# Patient Record
Sex: Female | Born: 1995 | Race: Black or African American | Hispanic: No | Marital: Single | Smoking: Never smoker
Health system: Southern US, Community
[De-identification: ages and names within clinical notes are randomized; demographics above are authoritative.]

## PROBLEM LIST (undated history)

## (undated) ENCOUNTER — Inpatient Hospital Stay (HOSPITAL_COMMUNITY): Payer: Self-pay

## (undated) DIAGNOSIS — Z789 Other specified health status: Secondary | ICD-10-CM

## (undated) DIAGNOSIS — D649 Anemia, unspecified: Secondary | ICD-10-CM

## (undated) HISTORY — PX: NO PAST SURGERIES: SHX2092

---
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
## 2019-04-12 ENCOUNTER — Emergency Department (HOSPITAL_COMMUNITY)
Admission: EM | Admit: 2019-04-12 | Discharge: 2019-04-12 | Disposition: A | Discharge status: Home / Self Care | Payer: Self-pay | Attending: Emergency Medicine | Admitting: Emergency Medicine

## 2019-04-12 ENCOUNTER — Ambulatory Visit (HOSPITAL_COMMUNITY)
Admission: EM | Admit: 2019-04-12 | Discharge: 2019-04-12 | Disposition: A | Discharge status: Home / Self Care | Payer: Self-pay | Attending: Internal Medicine | Admitting: Internal Medicine

## 2019-04-12 ENCOUNTER — Other Ambulatory Visit: Payer: Self-pay

## 2019-04-12 ENCOUNTER — Encounter (HOSPITAL_COMMUNITY): Payer: Self-pay | Admitting: Emergency Medicine

## 2019-04-12 ENCOUNTER — Emergency Department (HOSPITAL_COMMUNITY): Payer: Self-pay

## 2019-04-12 ENCOUNTER — Ambulatory Visit (INDEPENDENT_AMBULATORY_CARE_PROVIDER_SITE_OTHER): Payer: Self-pay

## 2019-04-12 DIAGNOSIS — Z3202 Encounter for pregnancy test, result negative: Secondary | ICD-10-CM

## 2019-04-12 DIAGNOSIS — R1084 Generalized abdominal pain: Secondary | ICD-10-CM | POA: Insufficient documentation

## 2019-04-12 DIAGNOSIS — R110 Nausea: Secondary | ICD-10-CM | POA: Insufficient documentation

## 2019-04-12 DIAGNOSIS — K56600 Partial intestinal obstruction, unspecified as to cause: Secondary | ICD-10-CM

## 2019-04-12 LAB — URINALYSIS, ROUTINE W REFLEX MICROSCOPIC
Bilirubin Urine: NEGATIVE
Glucose, UA: NEGATIVE mg/dL
Hgb urine dipstick: NEGATIVE
Ketones, ur: 5 mg/dL — AB
Leukocytes,Ua: NEGATIVE
Nitrite: NEGATIVE
Protein, ur: NEGATIVE mg/dL
Specific Gravity, Urine: 1.024 (ref 1.005–1.030)
pH: 5 (ref 5.0–8.0)

## 2019-04-12 LAB — I-STAT BETA HCG BLOOD, ED (MC, WL, AP ONLY): I-stat hCG, quantitative: <5 m[IU]/mL (ref <5)

## 2019-04-12 LAB — COMPREHENSIVE METABOLIC PANEL
ALT: 22 U/L (ref 0–44)
AST: 19 U/L (ref 15–41)
Albumin: 3.6 g/dL (ref 3.5–5.0)
Alkaline Phosphatase: 77 U/L (ref 38–126)
Anion gap: 9 (ref 5–15)
BUN: 10 mg/dL (ref 6–20)
CO2: 23 mmol/L (ref 22–32)
Calcium: 9 mg/dL (ref 8.9–10.3)
Chloride: 102 mmol/L (ref 98–111)
Creatinine, Ser: 0.71 mg/dL (ref 0.44–1.00)
GFR calc Af Amer: >60 mL/min (ref >60)
GFR calc non Af Amer: >60 mL/min (ref >60)
Glucose, Bld: 98 mg/dL (ref 70–99)
Potassium: 3.9 mmol/L (ref 3.5–5.1)
Sodium: 134 mmol/L — ABNORMAL LOW (ref 135–145)
Total Bilirubin: 0.4 mg/dL (ref 0.3–1.2)
Total Protein: 7.9 g/dL (ref 6.5–8.1)

## 2019-04-12 LAB — CBC
HCT: 41.9 % (ref 36.0–46.0)
Hemoglobin: 13.5 g/dL (ref 12.0–15.0)
MCH: 29.8 pg (ref 26.0–34.0)
MCHC: 32.2 g/dL (ref 30.0–36.0)
MCV: 92.5 fL (ref 80.0–100.0)
Platelets: 325 10*3/uL (ref 150–400)
RBC: 4.53 MIL/uL (ref 3.87–5.11)
RDW: 13.2 % (ref 11.5–15.5)
WBC: 12.9 10*3/uL — ABNORMAL HIGH (ref 4.0–10.5)
nRBC: 0 % (ref 0.0–0.2)

## 2019-04-12 LAB — POCT URINALYSIS DIP (DEVICE)
Bilirubin Urine: NEGATIVE
Glucose, UA: NEGATIVE mg/dL
Hgb urine dipstick: NEGATIVE
Ketones, ur: NEGATIVE mg/dL
Leukocytes,Ua: NEGATIVE
Nitrite: NEGATIVE
Protein, ur: NEGATIVE mg/dL
Specific Gravity, Urine: 1.025 (ref 1.005–1.030)
Urobilinogen, UA: 0.2 mg/dL (ref 0.0–1.0)
pH: 6.5 (ref 5.0–8.0)

## 2019-04-12 LAB — POCT PREGNANCY, URINE: Preg Test, Ur: NEGATIVE

## 2019-04-12 LAB — POC URINE PREG, ED: Preg Test, Ur: NEGATIVE

## 2019-04-12 LAB — LIPASE, BLOOD: Lipase: 17 U/L (ref 11–51)

## 2019-04-12 MED ORDER — IOHEXOL 300 MG/ML  SOLN
100.0000 mL | Freq: Once | INTRAMUSCULAR | Status: AC | PRN
  Administered 2019-04-12: 18:00:00 100 mL via INTRAVENOUS

## 2019-04-12 MED ORDER — SODIUM CHLORIDE 0.9% FLUSH
3.0000 mL | Freq: Once | INTRAVENOUS | Status: AC
  Administered 2019-04-12: 3 mL via INTRAVENOUS

## 2019-04-12 MED ORDER — ONDANSETRON 4 MG PO TBDP: ORAL_TABLET | ORAL | 0 refills | Status: DC

## 2019-04-12 NOTE — ED Triage Notes (Signed)
C/o generalized abd pain since yesterday with nausea.  Denies vomiting and diarrhea.

## 2019-04-12 NOTE — ED Provider Notes (Signed)
[facility] EMERGENCY DEPARTMENT Provider Note   CSN: [id number] Arrival date & time: 04/12/19  1520     History Chief Complaint  Patient presents with   Abdominal Pain    Katherine Velasquez is a 24 y.o. female.  Patient seen earlier today at urgent care with complaint of generalized abdominal pain with nausea, onset yesterday. No vomiting or diarrhea. Pain worsened overnight. No urinary symptoms. No fever or chills. Plain film obtained at urgent care revealed findings concerning for ileus/low grade SBO.    Abdominal Pain Pain location:  Generalized Pain quality: gnawing   Pain severity:  Moderate Onset quality:  Sudden Duration:  2 days Timing:  Intermittent Progression:  Worsening Chronicity:  New Associated symptoms: nausea   Associated symptoms: no constipation, no diarrhea and no vomiting        Past Medical History:  Diagnosis Date   Anemia    Medical history non-contributory     Patient Active Problem List   Diagnosis Date Noted   Menorrhagia 06/21/2015   Severe anemia 01/01/2015   Iron deficiency anemia due to chronic blood loss 05/24/2012   Menorrhagia with regular cycle 05/24/2012    Past Surgical History:  Procedure Laterality Date   NO PAST SURGERIES       OB History    Gravida  0   Para  0   Term  0   Preterm  0   AB  0   Living  0     SAB  0   TAB  0   Ectopic  0   Multiple  0   Live Births              No family history on file.  Social History   Tobacco Use   Smoking status: Never Smoker   Smokeless tobacco: Never Used  Substance Use Topics   Alcohol use: No   Drug use: No    Home Medications Prior to Admission medications   Medication Sig Start Date End Date Taking? Authorizing Provider  Iron TABS Take 1 tablet by mouth daily. 01/02/15   Edwinna Areola, DO  norgestimate-ethinyl estradiol (ORTHO-CYCLEN,SPRINTEC,PREVIFEM) 0.25-35 MG-MCG tablet Take 1 tablet by mouth daily.  06/14/12 01/23/13  Constant, Peggy, MD    Allergies    Patient has no known allergies.  Review of Systems   Review of Systems  Constitutional: Negative for appetite change.  Gastrointestinal: Positive for abdominal pain and nausea. Negative for constipation, diarrhea and vomiting.  Genitourinary: Negative for difficulty urinating.  Musculoskeletal: Negative for back pain.  All other systems reviewed and are negative.   Physical Exam Updated Vital Signs BP 122/80    Pulse 71    Temp 98.7 F (37.1 C) (Oral)    Resp 16    LMP 03/20/2019    SpO2 100%   Physical Exam Constitutional:      Appearance: She is well-developed. She is not ill-appearing.  HENT:     Head: Normocephalic.  Eyes:     Conjunctiva/sclera: Conjunctivae normal.  Cardiovascular:     Rate and Rhythm: Normal rate and regular rhythm.  Pulmonary:     Effort: Pulmonary effort is normal.     Breath sounds: Normal breath sounds.  Abdominal:     General: Abdomen is flat. There is no distension or abdominal bruit.     Palpations: Abdomen is soft.     Tenderness: There is generalized abdominal tenderness. There is no right CVA tenderness or left CVA tenderness.  Musculoskeletal:        General: No swelling.  Skin:    General: Skin is warm and dry.  Neurological:     Mental Status: She is alert and oriented to person, place, and time.  Psychiatric:        Mood and Affect: Mood normal.     ED Results / Procedures / Treatments   Labs (all labs ordered are listed, but only abnormal results are displayed) Labs Reviewed  COMPREHENSIVE METABOLIC PANEL - Abnormal; Notable for the following components:      Result Value   Sodium 134 (*)    All other components within normal limits  CBC - Abnormal; Notable for the following components:   WBC 12.9 (*)    All other components within normal limits  LIPASE, BLOOD  URINALYSIS, ROUTINE W REFLEX MICROSCOPIC  I-STAT BETA HCG BLOOD, ED ([facility], [facility], [facility] ONLY)     EKG None  Radiology DG Abdomen 1 View  Result Date: 04/12/2019 CLINICAL DATA:  Abdominal pain since yesterday with normal bowel movements. EXAM: ABDOMEN - 1 VIEW COMPARISON:  None. FINDINGS: Two images of the abdomen and pelvis. Small bowel loops within the left side of the abdomen measure up to 4.5 cm. Normal caliber colon, stool and gas filled. No gross free intraperitoneal air. No abnormal abdominal calcifications. No appendicolith. IMPRESSION: Mild to moderate small bowel dilatation, which could represent mild adynamic ileus or low-grade partial small bowel obstruction. Electronically Signed   By: Abigail Miyamoto M.D.   On: 04/12/2019 14:52   CT ABDOMEN PELVIS W CONTRAST  Result Date: 04/12/2019 CLINICAL DATA:  Abdominal pain. EXAM: CT ABDOMEN AND PELVIS WITH CONTRAST TECHNIQUE: Multidetector CT imaging of the abdomen and pelvis was performed using the standard protocol following bolus administration of intravenous contrast. CONTRAST:  118mL OMNIPAQUE IOHEXOL 300 MG/ML  SOLN COMPARISON:  None. FINDINGS: Lower chest: No acute abnormality. Hepatobiliary: No focal liver abnormality is seen. Mild diffuse fatty infiltration of the liver parenchyma is noted. No gallstones, gallbladder wall thickening, or biliary dilatation. Pancreas: Unremarkable. No pancreatic ductal dilatation or surrounding inflammatory changes. Spleen: Normal in size without focal abnormality. Adrenals/Urinary Tract: Adrenal glands are unremarkable. Kidneys are normal, without renal calculi, focal lesion, or hydronephrosis. Bladder is unremarkable. Stomach/Bowel: Stomach is within normal limits. Appendix appears normal. No evidence of bowel wall thickening, distention, or inflammatory changes. Vascular/Lymphatic: No significant vascular findings are present. No enlarged abdominal or pelvic lymph nodes. Reproductive: The uterus is unremarkable. A 2.0 cm cystic appearing area is seen along the posterior aspect of the right adnexa (axial  CT image 73, CT series number 3). Other: No abdominal wall hernia or abnormality. No abdominopelvic ascites. Musculoskeletal: No acute or significant osseous findings. IMPRESSION: 1. Fatty liver. 2. Small right adnexal cyst, likely ovarian in origin. Electronically Signed   By: Virgina Norfolk M.D.   On: 04/12/2019 18:21    Procedures Procedures (including critical care time)  Medications Ordered in ED Medications  sodium chloride flush (NS) 0.9 % injection 3 mL (has no administration in time range)    ED Course  I have reviewed the triage vital signs and the nursing notes.  Pertinent labs & imaging results that were available during my care of the patient were reviewed by me and considered in my medical decision making (see chart for details).    MDM Rules/Calculators/A&P                      Patient is nontoxic,  nonseptic appearing, in no apparent distress.   Labs, imaging and vitals reviewed.  Patient does not meet the SIRS or Sepsis criteria.  On repeat exam patient does not have a surgical abdomen and there are no peritoneal signs.  No indication of appendicitis, bowel obstruction, bowel perforation, cholecystitis, diverticulitis, PID or ectopic pregnancy.  Patient discharged home with symptomatic treatment and given strict instructions for follow-up with their primary care physician.  I have also discussed reasons to return immediately to the ER.  Patient expresses understanding and agrees with plan. .   Final Clinical Impression(s) / ED Diagnoses Final diagnoses:  Generalized abdominal pain  Nausea    Rx / DC Orders ED Discharge Orders    None       Felicie Morn, NP 04/12/19 2317    Sabas Sous, MD 04/13/19 [phone]

## 2019-04-12 NOTE — ED Provider Notes (Signed)
[facility]    CSN: [id number] Arrival date & time: 04/12/19  1245      History   Chief Complaint Chief Complaint  Patient presents with  . Abdominal Pain    HPI Katherine Velasquez is a 24 y.o. female with no past medical history comes to urgent care with a 2-day history of generalized abdominal pain with nausea which started yesterday.  Patient says pain is worsened overnight.  She has some nausea but no vomiting.  She denies any abdominal distention.  No known aggravating factors.  No known relieving factors.  Pain is fairly constant at this time.  Patient had a bowel movement this morning.  Bowel movement was apparently soft to loose.  No dizziness, fever or chills.  Patient has not tried any over-the-counter medications. HPI  Past Medical History:  Diagnosis Date  . Anemia   . Medical history non-contributory     Patient Active Problem List   Diagnosis Date Noted  . Menorrhagia 06/21/2015  . Severe anemia 01/01/2015  . Iron deficiency anemia due to chronic blood loss 05/24/2012  . Menorrhagia with regular cycle 05/24/2012    Past Surgical History:  Procedure Laterality Date  . NO PAST SURGERIES      OB History    Gravida  0   Para  0   Term  0   Preterm  0   AB  0   Living  0     SAB  0   TAB  0   Ectopic  0   Multiple  0   Live Births               Home Medications    Prior to Admission medications   Medication Sig Start Date End Date Taking? Authorizing Provider  Iron TABS Take 1 tablet by mouth daily. 01/02/15   Sherlyn Hay, DO  norgestimate-ethinyl estradiol (ORTHO-CYCLEN,SPRINTEC,PREVIFEM) 0.25-35 MG-MCG tablet Take 1 tablet by mouth daily. 06/14/12 01/23/13  Constant, Peggy, MD    Family History No family history on file.  Social History Social History   Tobacco Use  . Smoking status: Never Smoker  Substance Use Topics  . Alcohol use: No  . Drug use: No     Allergies   Patient has no known  allergies.   Review of Systems Review of Systems  Constitutional: Negative for activity change, chills and fever.  HENT: Negative.   Respiratory: Negative for cough, chest tightness and shortness of breath.   Gastrointestinal: Positive for abdominal pain and nausea. Negative for abdominal distention, diarrhea and vomiting.  Genitourinary: Negative for dysuria, flank pain and urgency.  Neurological: Negative for dizziness, light-headedness and headaches.     Physical Exam Triage Vital Signs ED Triage Vitals  Enc Vitals Group     BP 04/12/19 1326 123/77     Pulse Rate 04/12/19 1326 75     Resp 04/12/19 1326 16     Temp 04/12/19 1326 98 F (36.7 C)     Temp Source 04/12/19 1326 Oral     SpO2 04/12/19 1326 100 %     Weight --      Height --      Head Circumference --      Peak Flow --      Pain Score 04/12/19 1327 8     Pain Loc --      Pain Edu? --      Excl. in [place]? --    No data found.  Updated  Vital Signs BP 123/77 (BP Location: Right Arm)   Pulse 75   Temp 98 F (36.7 C) (Oral)   Resp 16   LMP 03/20/2019   SpO2 100%   Visual Acuity Right Eye Distance:   Left Eye Distance:   Bilateral Distance:    Right Eye Near:   Left Eye Near:    Bilateral Near:     Physical Exam Constitutional:      General: She is not in acute distress.    Appearance: She is not ill-appearing.  Cardiovascular:     Rate and Rhythm: Normal rate and regular rhythm.     Heart sounds: Normal heart sounds.  Pulmonary:     Effort: Pulmonary effort is normal.     Breath sounds: Normal breath sounds. No wheezing or rhonchi.  Abdominal:     Palpations: Abdomen is soft. There is no shifting dullness, hepatomegaly or splenomegaly.     Tenderness: There is generalized abdominal tenderness. There is no right CVA tenderness, left CVA tenderness, guarding or rebound. Negative signs include Murphy's sign.     Hernia: No hernia is present.  Skin:    General: Skin is warm and dry.     Capillary  Refill: Capillary refill takes less than 2 seconds.     Coloration: Skin is not cyanotic or pale.     Findings: No erythema.  Neurological:     Mental Status: She is alert.      UC Treatments / Results  Labs (all labs ordered are listed, but only abnormal results are displayed) Labs Reviewed  POCT URINALYSIS DIP (DEVICE)  POCT PREGNANCY, URINE  POC URINE PREG, ED    EKG   Radiology DG Abdomen 1 View  Result Date: 04/12/2019 CLINICAL DATA:  Abdominal pain since yesterday with normal bowel movements. EXAM: ABDOMEN - 1 VIEW COMPARISON:  None. FINDINGS: Two images of the abdomen and pelvis. Small bowel loops within the left side of the abdomen measure up to 4.5 cm. Normal caliber colon, stool and gas filled. No gross free intraperitoneal air. No abnormal abdominal calcifications. No appendicolith. IMPRESSION: Mild to moderate small bowel dilatation, which could represent mild adynamic ileus or low-grade partial small bowel obstruction. Electronically Signed   By: Jeronimo Greaves M.D.   On: 04/12/2019 14:52    Procedures Procedures (including critical care time)  Medications Ordered in UC Medications - No data to display  Initial Impression / Assessment and Plan / UC Course  I have reviewed the triage vital signs and the nursing notes.  Pertinent labs & imaging results that were available during my care of the patient were reviewed by me and considered in my medical decision making (see chart for details).     1.  Abdominal pain: KUB suggests mild to moderate small bowel dilatation possibly adynamic ileus/low-grade partial small bowel obstruction: Patient is advised to go to the emergency department since she might need further imaging to better delineate the findings on the x-ray of the abdomen.  Patient is agreeable to going to the ED.  She is accompanied by significant other.   Final Clinical Impressions(s) / UC Diagnoses   Final diagnoses:  Partial intestinal obstruction,  unspecified cause [facility])     Discharge Instructions     Patient is advised to go to the emergency department for further evaluation.   ED Prescriptions    None     PDMP not reviewed this encounter.   Merrilee Jansky, MD 04/12/19 [phone]

## 2019-04-12 NOTE — ED Triage Notes (Signed)
Pt present abdominal pain with nausea, symptom started yesterday. Pt state the pain hurts when with any type of movement.

## 2019-04-12 NOTE — Discharge Instructions (Signed)
Patient is advised to go to the emergency department for further evaluation 

## 2020-02-09 ENCOUNTER — Inpatient Hospital Stay (HOSPITAL_COMMUNITY)
Admission: AD | Admit: 2020-02-09 | Discharge: 2020-02-09 | Disposition: A | Discharge status: Home / Self Care | Payer: Self-pay | Attending: Obstetrics and Gynecology | Admitting: Obstetrics and Gynecology

## 2020-02-09 ENCOUNTER — Encounter (HOSPITAL_COMMUNITY): Payer: Self-pay | Admitting: Obstetrics and Gynecology

## 2020-02-09 ENCOUNTER — Other Ambulatory Visit: Payer: Self-pay

## 2020-02-09 DIAGNOSIS — O210 Mild hyperemesis gravidarum: Secondary | ICD-10-CM

## 2020-02-09 DIAGNOSIS — O26891 Other specified pregnancy related conditions, first trimester: Secondary | ICD-10-CM | POA: Insufficient documentation

## 2020-02-09 DIAGNOSIS — O219 Vomiting of pregnancy, unspecified: Secondary | ICD-10-CM

## 2020-02-09 DIAGNOSIS — Z3A10 10 weeks gestation of pregnancy: Secondary | ICD-10-CM | POA: Insufficient documentation

## 2020-02-09 DIAGNOSIS — R109 Unspecified abdominal pain: Secondary | ICD-10-CM | POA: Insufficient documentation

## 2020-02-09 LAB — URINALYSIS, ROUTINE W REFLEX MICROSCOPIC
Bilirubin Urine: NEGATIVE
Glucose, UA: NEGATIVE mg/dL
Hgb urine dipstick: NEGATIVE
Ketones, ur: NEGATIVE mg/dL
Leukocytes,Ua: NEGATIVE
Nitrite: NEGATIVE
Protein, ur: NEGATIVE mg/dL
Specific Gravity, Urine: 1.019 (ref 1.005–1.030)
pH: 6 (ref 5.0–8.0)

## 2020-02-09 LAB — POCT PREGNANCY, URINE: Preg Test, Ur: POSITIVE — AB

## 2020-02-09 MED ORDER — PROMETHAZINE HCL 12.5 MG PO TABS: 12.5000 mg | ORAL_TABLET | Freq: Four times a day (QID) | ORAL | 1 refills | Status: DC | PRN

## 2020-02-09 MED ORDER — PROMETHAZINE HCL 25 MG PO TABS
25.0000 mg | ORAL_TABLET | Freq: Once | ORAL | Status: AC
  Administered 2020-02-09: 25 mg via ORAL
  Filled 2020-02-09: qty 1

## 2020-02-09 NOTE — MAU Provider Note (Signed)
History     [id number]  Arrival date and time: 02/09/20 1209    Chief Complaint  Patient presents with  . Abdominal Pain  . Nausea     HPI Katherine Velasquez is a 24 y.o. at [redacted]w[redacted]d by patient reported LMP with unremarkable PMHx, who presents for n/v.   Had some period like cramps two weeks ago, but none since then Has had some nausea but no vomiting, able to keep food and liquids down No vaginal bleeding or discharge Uncertain LMP, sometime in September Has not established care     OB History    Gravida  1   Para  0   Term  0   Preterm  0   AB  0   Living  0     SAB  0   TAB  0   Ectopic  0   Multiple  0   Live Births              Past Medical History:  Diagnosis Date  . Anemia   . Medical history non-contributory     Past Surgical History:  Procedure Laterality Date  . NO PAST SURGERIES      History reviewed. No pertinent family history.  Social History   Socioeconomic History  . Marital status: Single    Spouse name: Not on file  . Number of children: Not on file  . Years of education: Not on file  . Highest education level: Not on file  Occupational History  . Not on file  Tobacco Use  . Smoking status: Never Smoker  . Smokeless tobacco: Never Used  Substance and Sexual Activity  . Alcohol use: No  . Drug use: No  . Sexual activity: Never    Birth control/protection: None  Other Topics Concern  . Not on file  Social History Narrative  . Not on file   Social Determinants of Health   Financial Resource Strain:   . Difficulty of Paying Living Expenses: Not on file  Food Insecurity:   . Worried About Programme researcher, broadcasting/film/video in the Last Year: Not on file  . Ran Out of Food in the Last Year: Not on file  Transportation Needs:   . Lack of Transportation (Medical): Not on file  . Lack of Transportation (Non-Medical): Not on file  Physical Activity:   . Days of Exercise per Week: Not on file  . Minutes of Exercise per Session:  Not on file  Stress:   . Feeling of Stress : Not on file  Social Connections:   . Frequency of Communication with Friends and Family: Not on file  . Frequency of Social Gatherings with Friends and Family: Not on file  . Attends Religious Services: Not on file  . Active Member of Clubs or Organizations: Not on file  . Attends Banker Meetings: Not on file  . Marital Status: Not on file  Intimate Partner Violence:   . Fear of Current or Ex-Partner: Not on file  . Emotionally Abused: Not on file  . Physically Abused: Not on file  . Sexually Abused: Not on file    No Known Allergies  No current facility-administered medications on file prior to encounter.   Current Outpatient Medications on File Prior to Encounter  Medication Sig Dispense Refill  . Iron TABS Take 1 tablet by mouth daily. (Patient not taking: Reported on 04/12/2019) 30 tablet 6  . ondansetron (ZOFRAN ODT) 4 MG disintegrating tablet 4mg  ODT q6  hours prn nausea/vomit 4 tablet 0  . [DISCONTINUED] norgestimate-ethinyl estradiol (ORTHO-CYCLEN,SPRINTEC,PREVIFEM) 0.25-35 MG-MCG tablet Take 1 tablet by mouth daily. 1 Package 11     ROS Pertinent positives and negative per HPI, all others reviewed and negative  Physical Exam   BP 116/62 (BP Location: Right Arm)   Pulse 69   Temp (!) 97.5 F (36.4 C)   Resp 18   Ht 5\' 4"  (1.626 m)   Wt 96.2 kg   LMP 11/28/2019   BMI 36.39 kg/m   Physical Exam Vitals reviewed.  Constitutional:      General: She is not in acute distress.    Appearance: She is well-developed. She is not diaphoretic.  Eyes:     General: No scleral icterus. Pulmonary:     Effort: Pulmonary effort is normal. No respiratory distress.  Abdominal:     General: There is no distension.     Palpations: Abdomen is soft.     Tenderness: There is no abdominal tenderness. There is no guarding or rebound.  Skin:    General: Skin is warm and dry.  Neurological:     Mental Status: She is alert.      Coordination: Coordination normal.     Cervical Exam    Bedside Ultrasound Pt informed that the ultrasound is considered a limited OB ultrasound and is not intended to be a complete ultrasound exam.  Patient also informed that the ultrasound is not being completed with the intent of assessing for fetal or placental anomalies or any pelvic abnormalities.  Explained that the purpose of today's ultrasound is to assess for  viability.  Patient acknowledges the purpose of the exam and the limitations of the study.      My interpretation: Yolk sac and fetal pole with fetal cardiac activity seen  FHT n/a  Labs Results for orders placed or performed during the hospital encounter of 02/09/20 (from the past 24 hour(s))  Urinalysis, Routine w reflex microscopic     Status: None   Collection Time: 02/09/20  1:39 PM  Result Value Ref Range   Color, Urine YELLOW YELLOW   APPearance CLEAR CLEAR   Specific Gravity, Urine 1.019 1.005 - 1.030   pH 6.0 5.0 - 8.0   Glucose, UA NEGATIVE NEGATIVE mg/dL   Hgb urine dipstick NEGATIVE NEGATIVE   Bilirubin Urine NEGATIVE NEGATIVE   Ketones, ur NEGATIVE NEGATIVE mg/dL   Protein, ur NEGATIVE NEGATIVE mg/dL   Nitrite NEGATIVE NEGATIVE   Leukocytes,Ua NEGATIVE NEGATIVE  Pregnancy, urine POC     Status: Abnormal   Collection Time: 02/09/20  1:40 PM  Result Value Ref Range   Preg Test, Ur POSITIVE (A) NEGATIVE    Imaging No results found.  MAU Course  Procedures  Lab Orders     Urinalysis, Routine w reflex microscopic Urine, Clean Catch     Pregnancy, urine POC Meds ordered this encounter  Medications  . promethazine (PHENERGAN) tablet 25 mg   Imaging Orders  No imaging studies ordered today    MDM moderate  Assessment and Plan  #Nausea and vomiting in pregnancy, first trimester Mild n/v of pregnancy. IUP seen on BSUS. Given phenergan rx for nausea, and list of prenatal care providers to follow up with.    02/11/20

## 2020-02-09 NOTE — MAU Note (Signed)
Pt stated she had a positive HPT. Has been feeling nauseated and having cramping on and off. Has some this morning but not at this time. Denies any vag bleeding or discharge.

## 2020-02-09 NOTE — Discharge Instructions (Signed)
[facility]    [facility]       Phone: [phone]  [facility]   Phone: [phone]  [facility] [place]  Phone: [phone]  [facility] [place]  Phone: [phone]  [facility] [place]  Phone: [phone]  [facility]   Phone: [phone]  [facility]       Phone: [phone]  [facility]    Phone: [phone]   [facility]    Phone: [phone]  [facility]    Phone: [phone]  [facility]    Phone: [phone]  [facility]       Phone: [phone]   [facility]  Phone: [phone]  [facility]    Phone: [phone]  [facility]   Phone: [phone]  [facility]      Phone: [phone]  [facility]    Phone: [phone]       First Trimester of Pregnancy The first trimester of pregnancy is from week 1 until the end of week 13 (months 1 through 3). A week after a sperm fertilizes an egg, the egg will implant on the wall of the uterus. This embryo will begin to develop into a baby. Genes from you and your partner will form the baby. The female genes will determine whether the baby will be a boy or a girl. At 6-8 weeks, the eyes and face will be formed, and the heartbeat can be seen on ultrasound. At the end of 12 weeks, all the baby's organs will be formed. Now that you are pregnant, you will want to do everything you can to have a healthy baby. Two of the most important things are to get good prenatal care and to follow your health care provider's instructions. Prenatal care is all the medical care you receive before the  baby's birth. This care will help prevent, find, and treat any problems during the pregnancy and childbirth. Body changes during your first trimester Your body goes through many changes during pregnancy. The changes vary from woman to woman.  You may gain or lose a couple of pounds at first.  You may feel sick to your stomach (nauseous) and you may throw up (vomit). If the vomiting is uncontrollable, call your health care provider.  You may tire easily.  You may develop headaches that can be relieved by medicines. All medicines should be approved by your health care provider.  You may urinate more often. Painful urination may mean you have a bladder infection.  You may develop heartburn as a result of your pregnancy.  You may develop constipation because certain hormones are causing the muscles that push stool through your intestines to slow down.  You may develop hemorrhoids or swollen veins (varicose veins).  Your breasts may begin to grow larger and become tender. Your nipples may stick out more, and the tissue that surrounds them (areola) may become darker.  Your gums may bleed and may be sensitive to brushing and flossing.  Dark spots or blotches (chloasma, mask of pregnancy) may develop on your face. This will likely fade after the baby is born.  Your menstrual periods will stop.  You may have a loss of appetite.  You may develop cravings for certain kinds of  food.  You may have changes in your emotions from day to day, such as being excited to be pregnant or being concerned that something may go wrong with the pregnancy and baby.  You may have more vivid and strange dreams.  You may have changes in your hair. These can include thickening of your hair, rapid growth, and changes in texture. Some women also have hair loss during or after pregnancy, or hair that feels dry or thin. Your hair will most likely return to normal after your baby is born. What to expect at prenatal  visits During a routine prenatal visit:  You will be weighed to make sure you and the baby are growing normally.  Your blood pressure will be taken.  Your abdomen will be measured to track your baby's growth.  The fetal heartbeat will be listened to between weeks 10 and 14 of your pregnancy.  Test results from any previous visits will be discussed. Your health care provider may ask you:  How you are feeling.  If you are feeling the baby move.  If you have had any abnormal symptoms, such as leaking fluid, bleeding, severe headaches, or abdominal cramping.  If you are using any tobacco products, including cigarettes, chewing tobacco, and electronic cigarettes.  If you have any questions. Other tests that may be performed during your first trimester include:  Blood tests to find your blood type and to check for the presence of any previous infections. The tests will also be used to check for low iron levels (anemia) and protein on red blood cells (Rh antibodies). Depending on your risk factors, or if you previously had diabetes during pregnancy, you may have tests to check for high blood sugar that affects pregnant women (gestational diabetes).  Urine tests to check for infections, diabetes, or protein in the urine.  An ultrasound to confirm the proper growth and development of the baby.  Fetal screens for spinal cord problems (spina bifida) and Down syndrome.  HIV (human immunodeficiency virus) testing. Routine prenatal testing includes screening for HIV, unless you choose not to have this test.  You may need other tests to make sure you and the baby are doing well. Follow these instructions at home: Medicines  Follow your health care provider's instructions regarding medicine use. Specific medicines may be either safe or unsafe to take during pregnancy.  Take a prenatal vitamin that contains at least 600 micrograms (mcg) of folic acid.  If you develop constipation, try  taking a stool softener if your health care provider approves. Eating and drinking   Eat a balanced diet that includes fresh fruits and vegetables, whole grains, good sources of protein such as meat, eggs, or tofu, and low-fat dairy. Your health care provider will help you determine the amount of weight gain that is right for you.  Avoid raw meat and uncooked cheese. These carry germs that can cause birth defects in the baby.  Eating four or five small meals rather than three large meals a day may help relieve nausea and vomiting. If you start to feel nauseous, eating a few soda crackers can be helpful. Drinking liquids between meals, instead of during meals, also seems to help ease nausea and vomiting.  Limit foods that are high in fat and processed sugars, such as fried and sweet foods.  To prevent constipation: ? Eat foods that are high in fiber, such as fresh fruits and vegetables, whole grains, and beans. ? Drink enough fluid to keep your urine  clear or pale yellow. Activity  Exercise only as directed by your health care provider. Most women can continue their usual exercise routine during pregnancy. Try to exercise for 30 minutes at least 5 days a week. Exercising will help you: ? Control your weight. ? Stay in shape. ? Be prepared for labor and delivery.  Experiencing pain or cramping in the lower abdomen or lower back is a good sign that you should stop exercising. Check with your health care provider before continuing with normal exercises.  Try to avoid standing for long periods of time. Move your legs often if you must stand in one place for a long time.  Avoid heavy lifting.  Wear low-heeled shoes and practice good posture.  You may continue to have sex unless your health care provider tells you not to. Relieving pain and discomfort  Wear a good support bra to relieve breast tenderness.  Take warm sitz baths to soothe any pain or discomfort caused by hemorrhoids. Use  hemorrhoid cream if your health care provider approves.  Rest with your legs elevated if you have leg cramps or low back pain.  If you develop varicose veins in your legs, wear support hose. Elevate your feet for 15 minutes, 3-4 times a day. Limit salt in your diet. Prenatal care  Schedule your prenatal visits by the twelfth week of pregnancy. They are usually scheduled monthly at first, then more often in the last 2 months before delivery.  Write down your questions. Take them to your prenatal visits.  Keep all your prenatal visits as told by your health care provider. This is important. Safety  Wear your seat belt at all times when driving.  Make a list of emergency phone numbers, including numbers for family, friends, the hospital, and police and fire departments. General instructions  Ask your health care provider for a referral to a local prenatal education class. Begin classes no later than the beginning of month 6 of your pregnancy.  Ask for help if you have counseling or nutritional needs during pregnancy. Your health care provider can offer advice or refer you to specialists for help with various needs.  Do not use hot tubs, steam rooms, or saunas.  Do not douche or use tampons or scented sanitary pads.  Do not cross your legs for long periods of time.  Avoid cat litter boxes and soil used by cats. These carry germs that can cause birth defects in the baby and possibly loss of the fetus by miscarriage or stillbirth.  Avoid all smoking, herbs, alcohol, and medicines not prescribed by your health care provider. Chemicals in these products affect the formation and growth of the baby.  Do not use any products that contain nicotine or tobacco, such as cigarettes and e-cigarettes. If you need help quitting, ask your health care provider. You may receive counseling support and other resources to help you quit.  Schedule a dentist appointment. At home, brush your teeth with a soft  toothbrush and be gentle when you floss. Contact a health care provider if:  You have dizziness.  You have mild pelvic cramps, pelvic pressure, or nagging pain in the abdominal area.  You have persistent nausea, vomiting, or diarrhea.  You have a bad smelling vaginal discharge.  You have pain when you urinate.  You notice increased swelling in your face, hands, legs, or ankles.  You are exposed to fifth disease or chickenpox.  You are exposed to [place] measles (rubella) and have never had it.  Get help right away if:  You have a fever.  You are leaking fluid from your vagina.  You have spotting or bleeding from your vagina.  You have severe abdominal cramping or pain.  You have rapid weight gain or loss.  You vomit blood or material that looks like coffee grounds.  You develop a severe headache.  You have shortness of breath.  You have any kind of trauma, such as from a fall or a car accident. Summary  The first trimester of pregnancy is from week 1 until the end of week 13 (months 1 through 3).  Your body goes through many changes during pregnancy. The changes vary from woman to woman.  You will have routine prenatal visits. During those visits, your health care provider will examine you, discuss any test results you may have, and talk with you about how you are feeling. This information is not intended to replace advice given to you by your health care provider. Make sure you discuss any questions you have with your health care provider. Document Revised: 02/20/2017 Document Reviewed: 02/20/2016 Elsevier Patient Education  2020 [employer].

## 2020-03-05 LAB — OB RESULTS CONSOLE HIV ANTIBODY (ROUTINE TESTING): HIV: NONREACTIVE

## 2020-03-05 LAB — OB RESULTS CONSOLE RPR: RPR: NONREACTIVE

## 2020-03-05 LAB — OB RESULTS CONSOLE HEPATITIS B SURFACE ANTIGEN: Hepatitis B Surface Ag: NEGATIVE

## 2020-03-05 LAB — OB RESULTS CONSOLE GC/CHLAMYDIA
Chlamydia: NEGATIVE
Gonorrhea: NEGATIVE

## 2020-03-05 LAB — OB RESULTS CONSOLE ABO/RH: RH Type: POSITIVE

## 2020-03-05 LAB — OB RESULTS CONSOLE ANTIBODY SCREEN: Antibody Screen: NEGATIVE

## 2020-03-05 LAB — OB RESULTS CONSOLE RUBELLA ANTIBODY, IGM: Rubella: NON-IMMUNE/NOT IMMUNE

## 2020-03-24 NOTE — L&D Delivery Note (Signed)
OB/GYN Faculty Practice Delivery Note  Katherine Velasquez is a 25 y.o. G1P1001 s/p vacuum-assisted vaginal delivery at [redacted]w[redacted]d. She was admitted for post-dates IOL.   ROM: 1h 64m with light meconium-stained fluid GBS Status: positive; adequate antibiotics prior to delivery Maximum Maternal Temperature: 98.74F  Labor Progress: FB was placed on admission and pt was subsequently started on pitocin at 1945 on 10/05/20. AROM for light meconium-stained fluid was performed at 1248 on 10/06/20. She then progressed to complete cervical dilation at 1512, at which time fetal heart tracing notable for recurrent variable decelerations. Reassuringly, pt able to push from -1 to +2 fetal station on hands and knees and then delivered with vacuum-assistance as noted below.  Indication for operative vaginal delivery: non-reassuring fetal heart tones.  Patient was examined and found to be fully dilated with fetal station of +2.  Patient's bladder was noted to be empty, and there were no known fetal contraindications to operative vaginal delivery. EFW was 24% by recent ultrasound.  FHR tracing remarkable for recurrent variable decels to 80s.  Risks of vacuum assistance were discussed in detail, including but not limited to, bleeding, infection, damage to maternal tissues, fetal cephalohematoma, inability to effect vaginal delivery of the head or shoulder dystocia that cannot be resolved by established maneuvers and need for emergency cesarean section.  Patient gave verbal consent.   Delivery Date/Time: 10/06/20 at 1435 Delivery: The soft vacuum cup was positioned over the sagittal suture 3 cm anterior to posterior fontanelle.  Pressure was then increased to 500 mmHg, and the patient was instructed to push.  Pulling was administered along the pelvic curve while patient was pushing; there was 1 contraction and zero popoffs. Head delivered ROA with compound left fetal arm and hand. No nuchal cord present. Shoulder and body  delivered in usual fashion. Infant with spontaneous cry, placed on mother's abdomen, dried and stimulated. Cord clamped x 2 after 1-minute delay, and cut by FOB under my direct supervision. Arterial cord gas (pH 7.30) and cord blood drawn. Placenta delivered spontaneously with gentle cord traction. Fundus firm with massage and Pitocin. Labia, perineum, vagina, and cervix were inspected, notable for second degree perineal laceration s/p repair as noted below.   Placenta: 3-vessel cord, intact, sent to L&D Complications: non-reassuring fetal heart tones in second stage, Compound presentation with left fetal hand and arm Lacerations: 2nd degree perineal laceration s/p repair in standard fashion with use of 3-0 vicryl EBL: 350 ml Analgesia: epidural  Infant: viable female  APGARs 6 & 9  weight 3005g  Lynnda Shields, MD OB/GYN Fellow, Faculty Practice

## 2020-04-30 ENCOUNTER — Other Ambulatory Visit: Payer: Self-pay

## 2020-05-10 ENCOUNTER — Other Ambulatory Visit: Payer: Self-pay | Admitting: Obstetrics & Gynecology

## 2020-05-10 DIAGNOSIS — R772 Abnormality of alphafetoprotein: Secondary | ICD-10-CM

## 2020-05-21 ENCOUNTER — Other Ambulatory Visit: Payer: Self-pay | Admitting: *Deleted

## 2020-05-21 ENCOUNTER — Other Ambulatory Visit: Payer: Self-pay

## 2020-05-21 ENCOUNTER — Ambulatory Visit: Payer: Medicaid Other | Admitting: *Deleted

## 2020-05-21 ENCOUNTER — Ambulatory Visit: Payer: Medicaid Other | Attending: Obstetrics & Gynecology

## 2020-05-21 ENCOUNTER — Encounter: Payer: Self-pay | Admitting: *Deleted

## 2020-05-21 VITALS — BP 98/55 | HR 73

## 2020-05-21 DIAGNOSIS — Z3A21 21 weeks gestation of pregnancy: Secondary | ICD-10-CM

## 2020-05-21 DIAGNOSIS — O99012 Anemia complicating pregnancy, second trimester: Secondary | ICD-10-CM

## 2020-05-21 DIAGNOSIS — D649 Anemia, unspecified: Secondary | ICD-10-CM

## 2020-05-21 DIAGNOSIS — R772 Abnormality of alphafetoprotein: Secondary | ICD-10-CM

## 2020-05-21 DIAGNOSIS — Z9289 Personal history of other medical treatment: Secondary | ICD-10-CM

## 2020-05-21 DIAGNOSIS — O280 Abnormal hematological finding on antenatal screening of mother: Secondary | ICD-10-CM

## 2020-06-18 ENCOUNTER — Ambulatory Visit: Payer: Medicaid Other

## 2020-07-05 ENCOUNTER — Ambulatory Visit: Payer: Medicaid Other | Admitting: *Deleted

## 2020-07-05 ENCOUNTER — Other Ambulatory Visit: Payer: Self-pay

## 2020-07-05 ENCOUNTER — Ambulatory Visit: Payer: Medicaid Other | Attending: Obstetrics

## 2020-07-05 ENCOUNTER — Encounter: Payer: Self-pay | Admitting: *Deleted

## 2020-07-05 ENCOUNTER — Other Ambulatory Visit: Payer: Self-pay | Admitting: *Deleted

## 2020-07-05 VITALS — BP 105/59 | HR 74

## 2020-07-05 DIAGNOSIS — Z3A27 27 weeks gestation of pregnancy: Secondary | ICD-10-CM

## 2020-07-05 DIAGNOSIS — O281 Abnormal biochemical finding on antenatal screening of mother: Secondary | ICD-10-CM

## 2020-07-05 DIAGNOSIS — O321XX0 Maternal care for breech presentation, not applicable or unspecified: Secondary | ICD-10-CM

## 2020-07-05 DIAGNOSIS — Z363 Encounter for antenatal screening for malformations: Secondary | ICD-10-CM

## 2020-07-05 DIAGNOSIS — D649 Anemia, unspecified: Secondary | ICD-10-CM | POA: Diagnosis not present

## 2020-07-05 DIAGNOSIS — R772 Abnormality of alphafetoprotein: Secondary | ICD-10-CM

## 2020-07-05 DIAGNOSIS — O99012 Anemia complicating pregnancy, second trimester: Secondary | ICD-10-CM

## 2020-07-05 DIAGNOSIS — O283 Abnormal ultrasonic finding on antenatal screening of mother: Secondary | ICD-10-CM | POA: Diagnosis present

## 2020-08-03 ENCOUNTER — Encounter: Payer: Self-pay | Admitting: *Deleted

## 2020-08-03 ENCOUNTER — Other Ambulatory Visit: Payer: Self-pay

## 2020-08-03 ENCOUNTER — Ambulatory Visit: Payer: Medicaid Other | Attending: Maternal & Fetal Medicine

## 2020-08-03 ENCOUNTER — Other Ambulatory Visit: Payer: Self-pay | Admitting: *Deleted

## 2020-08-03 ENCOUNTER — Ambulatory Visit: Payer: Medicaid Other | Admitting: *Deleted

## 2020-08-03 VITALS — BP 103/65 | HR 66

## 2020-08-03 DIAGNOSIS — O283 Abnormal ultrasonic finding on antenatal screening of mother: Secondary | ICD-10-CM

## 2020-08-03 DIAGNOSIS — R772 Abnormality of alphafetoprotein: Secondary | ICD-10-CM | POA: Diagnosis present

## 2020-08-03 DIAGNOSIS — Z361 Encounter for antenatal screening for raised alphafetoprotein level: Secondary | ICD-10-CM | POA: Diagnosis not present

## 2020-08-03 DIAGNOSIS — Z362 Encounter for other antenatal screening follow-up: Secondary | ICD-10-CM

## 2020-08-03 DIAGNOSIS — Z3A32 32 weeks gestation of pregnancy: Secondary | ICD-10-CM | POA: Diagnosis not present

## 2020-08-03 DIAGNOSIS — O281 Abnormal biochemical finding on antenatal screening of mother: Secondary | ICD-10-CM

## 2020-08-27 ENCOUNTER — Other Ambulatory Visit: Payer: Self-pay

## 2020-08-27 ENCOUNTER — Ambulatory Visit: Payer: Medicaid Other | Admitting: *Deleted

## 2020-08-27 ENCOUNTER — Ambulatory Visit: Payer: Medicaid Other | Attending: Obstetrics and Gynecology

## 2020-08-27 ENCOUNTER — Encounter: Payer: Self-pay | Admitting: *Deleted

## 2020-08-27 VITALS — BP 109/62 | HR 82

## 2020-08-27 DIAGNOSIS — O281 Abnormal biochemical finding on antenatal screening of mother: Secondary | ICD-10-CM | POA: Diagnosis not present

## 2020-08-27 DIAGNOSIS — Z862 Personal history of diseases of the blood and blood-forming organs and certain disorders involving the immune mechanism: Secondary | ICD-10-CM | POA: Insufficient documentation

## 2020-08-27 DIAGNOSIS — Z361 Encounter for antenatal screening for raised alphafetoprotein level: Secondary | ICD-10-CM | POA: Diagnosis not present

## 2020-08-27 DIAGNOSIS — Z3A35 35 weeks gestation of pregnancy: Secondary | ICD-10-CM | POA: Diagnosis not present

## 2020-09-06 LAB — OB RESULTS CONSOLE GBS: GBS: POSITIVE

## 2020-10-01 ENCOUNTER — Encounter (HOSPITAL_COMMUNITY): Payer: Self-pay | Admitting: *Deleted

## 2020-10-01 ENCOUNTER — Telehealth (HOSPITAL_COMMUNITY): Payer: Self-pay | Admitting: *Deleted

## 2020-10-01 ENCOUNTER — Other Ambulatory Visit: Payer: Self-pay | Admitting: Nurse Practitioner

## 2020-10-01 ENCOUNTER — Ambulatory Visit: Payer: Medicaid Other

## 2020-10-01 DIAGNOSIS — Z363 Encounter for antenatal screening for malformations: Secondary | ICD-10-CM

## 2020-10-01 NOTE — Telephone Encounter (Signed)
Preadmission screen  

## 2020-10-02 ENCOUNTER — Ambulatory Visit: Payer: Medicaid Other | Attending: Nurse Practitioner

## 2020-10-03 ENCOUNTER — Other Ambulatory Visit (HOSPITAL_COMMUNITY)
Admission: RE | Admit: 2020-10-03 | Discharge: 2020-10-03 | Disposition: A | Discharge status: Home / Self Care | Payer: Medicaid Other | Source: Ambulatory Visit | Attending: Obstetrics and Gynecology | Admitting: Obstetrics and Gynecology

## 2020-10-03 ENCOUNTER — Other Ambulatory Visit: Payer: Self-pay | Admitting: Advanced Practice Midwife

## 2020-10-03 DIAGNOSIS — Z01812 Encounter for preprocedural laboratory examination: Secondary | ICD-10-CM | POA: Insufficient documentation

## 2020-10-03 DIAGNOSIS — Z20822 Contact with and (suspected) exposure to covid-19: Secondary | ICD-10-CM | POA: Diagnosis not present

## 2020-10-03 LAB — SARS CORONAVIRUS 2 (TAT 6-24 HRS): SARS Coronavirus 2: NEGATIVE

## 2020-10-05 ENCOUNTER — Inpatient Hospital Stay (HOSPITAL_COMMUNITY): Payer: Medicaid Other

## 2020-10-05 ENCOUNTER — Inpatient Hospital Stay (HOSPITAL_COMMUNITY)
Admission: AD | Admit: 2020-10-05 | Discharge: 2020-10-08 | DRG: 807 | Disposition: A | Discharge status: Home / Self Care | Payer: Medicaid Other | Attending: Obstetrics & Gynecology | Admitting: Obstetrics & Gynecology

## 2020-10-05 ENCOUNTER — Other Ambulatory Visit: Payer: Self-pay

## 2020-10-05 ENCOUNTER — Encounter (HOSPITAL_COMMUNITY): Payer: Self-pay | Admitting: Obstetrics and Gynecology

## 2020-10-05 DIAGNOSIS — O326XX0 Maternal care for compound presentation, not applicable or unspecified: Secondary | ICD-10-CM | POA: Diagnosis present

## 2020-10-05 DIAGNOSIS — O99824 Streptococcus B carrier state complicating childbirth: Secondary | ICD-10-CM | POA: Diagnosis present

## 2020-10-05 DIAGNOSIS — Z3A41 41 weeks gestation of pregnancy: Secondary | ICD-10-CM | POA: Diagnosis not present

## 2020-10-05 DIAGNOSIS — Z3490 Encounter for supervision of normal pregnancy, unspecified, unspecified trimester: Secondary | ICD-10-CM

## 2020-10-05 DIAGNOSIS — O9902 Anemia complicating childbirth: Secondary | ICD-10-CM | POA: Diagnosis present

## 2020-10-05 DIAGNOSIS — O480 Post-term pregnancy: Secondary | ICD-10-CM | POA: Diagnosis present

## 2020-10-05 DIAGNOSIS — D649 Anemia, unspecified: Secondary | ICD-10-CM | POA: Diagnosis present

## 2020-10-05 DIAGNOSIS — O99214 Obesity complicating childbirth: Secondary | ICD-10-CM | POA: Diagnosis present

## 2020-10-05 DIAGNOSIS — O99820 Streptococcus B carrier state complicating pregnancy: Secondary | ICD-10-CM | POA: Diagnosis not present

## 2020-10-05 LAB — TYPE AND SCREEN
ABO/RH(D): O POS
Antibody Screen: NEGATIVE

## 2020-10-05 LAB — CBC
HCT: 34.2 % — ABNORMAL LOW (ref 36.0–46.0)
Hemoglobin: 10.8 g/dL — ABNORMAL LOW (ref 12.0–15.0)
MCH: 29 pg (ref 26.0–34.0)
MCHC: 31.6 g/dL (ref 30.0–36.0)
MCV: 91.7 fL (ref 80.0–100.0)
Platelets: 296 10*3/uL (ref 150–400)
RBC: 3.73 MIL/uL — ABNORMAL LOW (ref 3.87–5.11)
RDW: 15.6 % — ABNORMAL HIGH (ref 11.5–15.5)
WBC: 9.3 10*3/uL (ref 4.0–10.5)
nRBC: 0 % (ref 0.0–0.2)

## 2020-10-05 MED ORDER — SOD CITRATE-CITRIC ACID 500-334 MG/5ML PO SOLN: 30.0000 mL | ORAL | Status: DC | PRN

## 2020-10-05 MED ORDER — PENICILLIN G POT IN DEXTROSE 60000 UNIT/ML IV SOLN
3.0000 10*6.[IU] | INTRAVENOUS | Status: DC
  Administered 2020-10-05 – 2020-10-06 (×4): 3 10*6.[IU] via INTRAVENOUS
  Filled 2020-10-05 (×4): qty 50

## 2020-10-05 MED ORDER — OXYTOCIN BOLUS FROM INFUSION
333.0000 mL | Freq: Once | INTRAVENOUS | Status: AC
  Administered 2020-10-06: 333 mL via INTRAVENOUS

## 2020-10-05 MED ORDER — OXYCODONE-ACETAMINOPHEN 5-325 MG PO TABS: 2.0000 | ORAL_TABLET | ORAL | Status: DC | PRN

## 2020-10-05 MED ORDER — MISOPROSTOL 50MCG HALF TABLET: 50.0000 ug | ORAL_TABLET | ORAL | Status: DC | PRN

## 2020-10-05 MED ORDER — OXYCODONE-ACETAMINOPHEN 5-325 MG PO TABS: 1.0000 | ORAL_TABLET | ORAL | Status: DC | PRN

## 2020-10-05 MED ORDER — LACTATED RINGERS IV SOLN: 500.0000 mL | INTRAVENOUS | Status: DC | PRN

## 2020-10-05 MED ORDER — ONDANSETRON HCL 4 MG/2ML IJ SOLN
4.0000 mg | Freq: Four times a day (QID) | INTRAMUSCULAR | Status: DC | PRN
  Administered 2020-10-05: 4 mg via INTRAVENOUS
  Filled 2020-10-05: qty 2

## 2020-10-05 MED ORDER — MISOPROSTOL 25 MCG QUARTER TABLET: 25.0000 ug | ORAL_TABLET | ORAL | Status: DC | PRN

## 2020-10-05 MED ORDER — TERBUTALINE SULFATE 1 MG/ML IJ SOLN: 0.2500 mg | Freq: Once | INTRAMUSCULAR | Status: DC | PRN

## 2020-10-05 MED ORDER — ACETAMINOPHEN 325 MG PO TABS: 650.0000 mg | ORAL_TABLET | ORAL | Status: DC | PRN

## 2020-10-05 MED ORDER — FENTANYL CITRATE (PF) 100 MCG/2ML IJ SOLN
100.0000 ug | INTRAMUSCULAR | Status: DC | PRN
  Administered 2020-10-05 – 2020-10-06 (×2): 100 ug via INTRAVENOUS
  Filled 2020-10-05 (×2): qty 2

## 2020-10-05 MED ORDER — OXYTOCIN-SODIUM CHLORIDE 30-0.9 UT/500ML-% IV SOLN
1.0000 m[IU]/min | INTRAVENOUS | Status: DC
  Administered 2020-10-05: 2 m[IU]/min via INTRAVENOUS

## 2020-10-05 MED ORDER — OXYTOCIN-SODIUM CHLORIDE 30-0.9 UT/500ML-% IV SOLN
2.5000 [IU]/h | INTRAVENOUS | Status: DC
  Administered 2020-10-06: 2.5 [IU]/h via INTRAVENOUS
  Filled 2020-10-05 (×2): qty 500

## 2020-10-05 MED ORDER — LIDOCAINE HCL (PF) 1 % IJ SOLN: 30.0000 mL | INTRAMUSCULAR | Status: DC | PRN

## 2020-10-05 MED ORDER — TERBUTALINE SULFATE 1 MG/ML IJ SOLN
0.2500 mg | Freq: Once | INTRAMUSCULAR | Status: DC | PRN
  Filled 2020-10-05: qty 1

## 2020-10-05 MED ORDER — LACTATED RINGERS IV SOLN: INTRAVENOUS | Status: DC

## 2020-10-05 MED ORDER — SODIUM CHLORIDE 0.9 % IV SOLN
5.0000 10*6.[IU] | Freq: Once | INTRAVENOUS | Status: AC
  Administered 2020-10-05: 5 10*6.[IU] via INTRAVENOUS
  Filled 2020-10-05: qty 5

## 2020-10-05 NOTE — Progress Notes (Signed)
Patient ID: Katherine Velasquez, female   DOB: Nov 22, 1995, 25 y.o.   MRN: [id number] Doing well Starting to have a little pain with UCS   Vitals:   10/05/20 1751 10/05/20 1847 10/05/20 1934 10/05/20 2125  BP: 113/70  117/72   Pulse: 65 65 64   Resp: 18 16    Temp:    97.8 F (36.6 C)  TempSrc:    Oral  Weight:      Height:       FHR reassuring UCs irregular  SVE deferred  Discussed options for pain management

## 2020-10-05 NOTE — H&P (Addendum)
OBSTETRIC ADMISSION HISTORY AND PHYSICAL  Katherine Velasquez is a 25 y.o. female G1P0000 with IUP at [redacted]w[redacted]d by U/S at [redacted]w[redacted]d presenting for IOL-postdates. She reports +FMs, No LOF, no VB, no blurry vision, headaches or peripheral edema, and RUQ pain.  She plans on breast feeding. She requests depo for birth control. [facility]    Dating: By U/S --->  Estimated Date of Delivery: 09/28/20  Sono:  08/27/2020@[redacted]w[redacted]d , CWD, normal anatomy, cephalic presentation, posterior placental lie, 2447g, 24% EFW  Prenatal History/Complications:  Anemia-Admit Hgb 10.8 (Had transfusions prior to preg)  Past Medical History: Past Medical History:  Diagnosis Date   Anemia    Medical history non-contributory     Past Surgical History: Past Surgical History:  Procedure Laterality Date   NO PAST SURGERIES      Obstetrical History: OB History     Gravida  1   Para  0   Term  0   Preterm  0   AB  0   Living  0      SAB  0   IAB  0   Ectopic  0   Multiple  0   Live Births              Social History Social History   Socioeconomic History   Marital status: Single    Spouse name: Not on file   Number of children: Not on file   Years of education: Not on file   Highest education level: Not on file  Occupational History   Not on file  Tobacco Use   Smoking status: Never   Smokeless tobacco: Never  Vaping Use   Vaping Use: Never used  Substance and Sexual Activity   Alcohol use: No   Drug use: No   Sexual activity: Never    Birth control/protection: None  Other Topics Concern   Not on file  Social History Narrative   Not on file   Social Determinants of Health   Financial Resource Strain: Not on file  Food Insecurity: Not on file  Transportation Needs: Not on file  Physical Activity: Not on file  Stress: Not on file  Social Connections: Not on file    Family History: Family History  Problem Relation Age of Onset   Arthritis  Father     Allergies: No Known Allergies  Medications Prior to Admission  Medication Sig Dispense Refill Last Dose   Prenatal Vit-Fe Fumarate-FA (PRENATAL MULTIVITAMIN) TABS tablet Take 1 tablet by mouth daily at 12 noon.   10/05/2020   promethazine (PHENERGAN) 12.5 MG tablet Take 1 tablet (12.5 mg total) by mouth every 6 (six) hours as needed for nausea or vomiting. 30 tablet 1 Past Month   Iron TABS Take 1 tablet by mouth daily. (Patient not taking: Reported on 04/12/2019) 30 tablet 6       PE Blood pressure 116/69, pulse 74, temperature 97.8 F (36.6 C), temperature source Oral, resp. rate 18, last menstrual period 11/28/2019. General appearance: alert, cooperative, and no distress HEENT: [place]/AT, MMM Lungs: Normal respiratory effort Heart: RRR Abdomen: soft, non-tender; bowel sounds normal, gravid  Extremities: Homans sign is negative, no sign of DVT Pelvic: As stated below.    Presentation: cephalic Fetal monitoringBaseline: 140 bpm, Variability: Good {> 6 bpm), Accelerations: Reactive, and Decelerations: Absent Uterine activity: Intermittent   Prenatal labs: ABO, Rh: O/Positive/-- (12/13 0000) Antibody: Negative (12/13 0000) Rubella: Nonimmune (12/13 0000) RPR: Nonreactive (12/13 0000)  HBsAg: Negative (12/13 0000)  HIV: Non-reactive (12/13 0000)  GBS:    1 hr Glucola passed Genetic screening  Elevated AFP Anatomy US intracardiac echogenic focus  Prenatal Transfer Tool  Maternal Diabetes: No Genetic Screening: Elevated AFP Maternal Ultrasounds/Referrals: Isolated EIF (echogenic intracardiac focus) Fetal Ultrasounds or other Referrals:  Referred to Materal Fetal Medicine -for elevated AFP Maternal Substance Abuse:  No Significant Maternal Medications:  None Significant Maternal Lab Results: Group B Strep positive  No results found for this or any previous visit (from the past 24 hour(s)).  Patient Active Problem List   Diagnosis Date Noted   Pregnant 10/05/2020    Menorrhagia 06/21/2015   Severe anemia 01/01/2015   Iron deficiency anemia due to chronic blood loss 05/24/2012   Menorrhagia with regular cycle 05/24/2012    Assessment/Plan:  Katherine Velasquez is a 25 y.o. G1P0000 at [redacted]w[redacted]d presenting for IOL-postdates.  #IOL: Discussed option of cytotec and CC. Inserted CC (70cc)@1530  without complications.  #Pain: Prn, wants epidural  #FWB: Cat 1  #GBS:  Pos>PCN #MOF: breast #MOC: depo #Circ:  yes  Alfredo Martinez MD, PGY1 10/05/2020, 2:12 PM   Attestation of Supervision of Student:  I confirm that I have verified the information documented in the  resident's  note and that I have also personally reperformed the history, physical exam and all medical decision making activities.  I have verified that all services and findings are accurately documented in this student's note; and I agree with management and plan as outlined in the documentation. I have also made any necessary editorial changes.  Thressa Sheller DNP, CNM  10/05/20  4:21 PM

## 2020-10-05 NOTE — Progress Notes (Addendum)
Labor Progress Note Katherine Velasquez is a 25 y.o. G1P0000 at [redacted]w[redacted]d presented for IOL-postdates. S: Patient is resting comfortably in bed.  O:  BP 113/70   Pulse 65   Temp 97.8 F (36.6 C) (Oral)   Resp 16   Ht 5\' 4"  (1.626 m)   Wt 211 lb (95.7 kg)   LMP 11/28/2019   BMI 36.22 kg/m  EFM: baseline 130 BPM/-accels/-decels  CVE: Dilation: 2.5 Effacement (%): Thick Station: -2 Presentation: Vertex Exam by:: [email or web address], MD/Laura Caldas, CNM   A&P: 26 y.o. G1P0000 [redacted]w[redacted]d presenting for IOL-postdates #IOL: Pt is progressing. Will continue with pitocin. Continue to titrate and monitor.  #Pain: PRN-epidural   #FWB: cat 1 #GBS:  Pos>PCN   Attestation of Supervision of Student:  I confirm that I have verified the information documented in the  resident's  note and that I have also personally reperformed the history, physical exam and all medical decision making activities.  I have verified that all services and findings are accurately documented in this student's note; and I agree with management and plan as outlined in the documentation. I have also made any necessary editorial changes.  [redacted]w[redacted]d DNP, CNM  10/05/20  7:40 PM

## 2020-10-06 ENCOUNTER — Inpatient Hospital Stay (HOSPITAL_COMMUNITY): Payer: Medicaid Other | Admitting: Anesthesiology

## 2020-10-06 ENCOUNTER — Encounter (HOSPITAL_COMMUNITY): Payer: Self-pay | Admitting: Obstetrics and Gynecology

## 2020-10-06 DIAGNOSIS — O480 Post-term pregnancy: Secondary | ICD-10-CM

## 2020-10-06 DIAGNOSIS — Z3A41 41 weeks gestation of pregnancy: Secondary | ICD-10-CM

## 2020-10-06 DIAGNOSIS — O99820 Streptococcus B carrier state complicating pregnancy: Secondary | ICD-10-CM

## 2020-10-06 LAB — RPR: RPR Ser Ql: NONREACTIVE

## 2020-10-06 MED ORDER — COCONUT OIL OIL: 1.0000 "application " | TOPICAL_OIL | Status: DC | PRN

## 2020-10-06 MED ORDER — SIMETHICONE 80 MG PO CHEW: 80.0000 mg | CHEWABLE_TABLET | ORAL | Status: DC | PRN

## 2020-10-06 MED ORDER — PRENATAL MULTIVITAMIN CH
1.0000 | ORAL_TABLET | Freq: Every day | ORAL | Status: DC
  Administered 2020-10-07: 1 via ORAL
  Filled 2020-10-06: qty 1

## 2020-10-06 MED ORDER — WITCH HAZEL-GLYCERIN EX PADS: 1.0000 "application " | MEDICATED_PAD | CUTANEOUS | Status: DC | PRN

## 2020-10-06 MED ORDER — FENTANYL-BUPIVACAINE-NACL 0.5-0.125-0.9 MG/250ML-% EP SOLN
EPIDURAL | Status: AC
  Filled 2020-10-06: qty 250

## 2020-10-06 MED ORDER — MEASLES, MUMPS & RUBELLA VAC IJ SOLR: 0.5000 mL | Freq: Once | INTRAMUSCULAR | Status: DC

## 2020-10-06 MED ORDER — DIPHENHYDRAMINE HCL 50 MG/ML IJ SOLN: 12.5000 mg | INTRAMUSCULAR | Status: DC | PRN

## 2020-10-06 MED ORDER — ONDANSETRON HCL 4 MG PO TABS: 4.0000 mg | ORAL_TABLET | ORAL | Status: DC | PRN

## 2020-10-06 MED ORDER — LACTATED RINGERS IV SOLN
500.0000 mL | Freq: Once | INTRAVENOUS | Status: AC
  Administered 2020-10-06: 500 mL via INTRAVENOUS

## 2020-10-06 MED ORDER — PHENYLEPHRINE 40 MCG/ML (10ML) SYRINGE FOR IV PUSH (FOR BLOOD PRESSURE SUPPORT): 80.0000 ug | PREFILLED_SYRINGE | INTRAVENOUS | Status: DC | PRN

## 2020-10-06 MED ORDER — DIPHENHYDRAMINE HCL 25 MG PO CAPS: 25.0000 mg | ORAL_CAPSULE | Freq: Four times a day (QID) | ORAL | Status: DC | PRN

## 2020-10-06 MED ORDER — FENTANYL-BUPIVACAINE-NACL 0.5-0.125-0.9 MG/250ML-% EP SOLN
12.0000 mL/h | EPIDURAL | Status: DC | PRN
  Administered 2020-10-06: 12 mL/h via EPIDURAL

## 2020-10-06 MED ORDER — MEDROXYPROGESTERONE ACETATE 150 MG/ML IM SUSP
150.0000 mg | Freq: Once | INTRAMUSCULAR | Status: AC
  Administered 2020-10-07: 150 mg via INTRAMUSCULAR
  Filled 2020-10-06: qty 1

## 2020-10-06 MED ORDER — DIBUCAINE (PERIANAL) 1 % EX OINT: 1.0000 "application " | TOPICAL_OINTMENT | CUTANEOUS | Status: DC | PRN

## 2020-10-06 MED ORDER — BENZOCAINE-MENTHOL 20-0.5 % EX AERO
1.0000 "application " | INHALATION_SPRAY | CUTANEOUS | Status: DC | PRN
  Administered 2020-10-07: 1 via TOPICAL
  Filled 2020-10-06: qty 56

## 2020-10-06 MED ORDER — EPHEDRINE 5 MG/ML INJ: 10.0000 mg | INTRAVENOUS | Status: DC | PRN

## 2020-10-06 MED ORDER — IBUPROFEN 600 MG PO TABS
600.0000 mg | ORAL_TABLET | Freq: Four times a day (QID) | ORAL | Status: DC
  Administered 2020-10-06 – 2020-10-08 (×7): 600 mg via ORAL
  Filled 2020-10-06 (×7): qty 1

## 2020-10-06 MED ORDER — ACETAMINOPHEN 325 MG PO TABS
650.0000 mg | ORAL_TABLET | Freq: Four times a day (QID) | ORAL | Status: DC
  Administered 2020-10-06 – 2020-10-08 (×5): 650 mg via ORAL
  Filled 2020-10-06 (×5): qty 2

## 2020-10-06 MED ORDER — PHENYLEPHRINE 40 MCG/ML (10ML) SYRINGE FOR IV PUSH (FOR BLOOD PRESSURE SUPPORT)
PREFILLED_SYRINGE | INTRAVENOUS | Status: AC
  Filled 2020-10-06: qty 10

## 2020-10-06 MED ORDER — ONDANSETRON HCL 4 MG/2ML IJ SOLN: 4.0000 mg | INTRAMUSCULAR | Status: DC | PRN

## 2020-10-06 MED ORDER — LIDOCAINE HCL (PF) 1 % IJ SOLN
INTRAMUSCULAR | Status: DC | PRN
  Administered 2020-10-06: 3 mL via EPIDURAL
  Administered 2020-10-06: 5 mL via EPIDURAL
  Administered 2020-10-06: 2 mL via EPIDURAL

## 2020-10-06 MED ORDER — TETANUS-DIPHTH-ACELL PERTUSSIS 5-2.5-18.5 LF-MCG/0.5 IM SUSY: 0.5000 mL | PREFILLED_SYRINGE | Freq: Once | INTRAMUSCULAR | Status: DC

## 2020-10-06 MED ORDER — SENNOSIDES-DOCUSATE SODIUM 8.6-50 MG PO TABS
2.0000 | ORAL_TABLET | Freq: Every day | ORAL | Status: DC
  Administered 2020-10-07: 2 via ORAL
  Filled 2020-10-06: qty 2

## 2020-10-06 MED ORDER — LACTATED RINGERS IV SOLN: 500.0000 mL | Freq: Once | INTRAVENOUS | Status: DC

## 2020-10-06 NOTE — Progress Notes (Signed)
Patient ID: Katherine Velasquez, female   DOB: 07-May-1995, 25 y.o.   MRN: [id number] Sleeping at intervals Foley came out a while ago  Vitals:   10/06/20 0002 10/06/20 0111 10/06/20 0200 10/06/20 0302  BP: 108/84 (!) 103/51 (!) 101/57 (!) 98/55  Pulse: 64 72 73 66  Resp:      Temp:   97.8 F (36.6 C)   TempSrc:   Oral   Weight:      Height:       FHR reassuring UCs q 2-3 min  Dilation: 5.5 Effacement (%): 70 Station: -3 Presentation: Vertex Exam by:: Wynelle Bourgeois, CNM  Continue Pitocin

## 2020-10-06 NOTE — Lactation Note (Signed)
This note was copied from a baby's chart. Lactation Consultation Note  Patient Name: Katherine Velasquez ZGYFV'C Date: 10/06/2020 Reason for consult: L&D Initial assessment;Mother's request;Primapara;1st time breastfeeding;Term Age:23 hours Infant able to latch in cross cradle on left breast. Dad doing breast compression while mother his holding infant in the latch. Signs of milk transfer noted.   LC to follow up on the floor.   Maternal Data Has patient been taught Hand Expression?: Yes Does the patient have breastfeeding experience prior to this delivery?: No  Feeding Mother's Current Feeding Choice: Breast Milk  LATCH Score Latch: Repeated attempts needed to sustain latch, nipple held in mouth throughout feeding, stimulation needed to elicit sucking reflex.  Audible Swallowing: A few with stimulation  Type of Nipple: Everted at rest and after stimulation  Comfort (Breast/Nipple): Soft / non-tender  Hold (Positioning): Assistance needed to correctly position infant at breast and maintain latch.  LATCH Score: 7   Lactation Tools Discussed/Used    Interventions Interventions: Breast feeding basics reviewed;Breast compression;Assisted with latch;Adjust position;Skin to skin;Support pillows;Hand express;Expressed milk;Education  Discharge    Consult Status Consult Status: Follow-up Date: 10/07/20 Follow-up type: In-patient    Katherine Barcia  Velasquez 10/06/2020, 5:20 PM

## 2020-10-06 NOTE — Anesthesia Preprocedure Evaluation (Signed)
Anesthesia Evaluation  Patient identified by MRN, date of birth, ID band Patient awake    Reviewed: Allergy & Precautions, NPO status , Patient's Chart, lab work & pertinent test results  Airway Mallampati: III  TM Distance: >3 FB Neck ROM: Full    Dental  (+) Teeth Intact, Dental Advisory Given   Pulmonary neg pulmonary ROS,    Pulmonary exam normal breath sounds clear to auscultation       Cardiovascular negative cardio ROS Normal cardiovascular exam Rhythm:Regular Rate:Normal     Neuro/Psych negative neurological ROS     GI/Hepatic negative GI ROS, Neg liver ROS,   Endo/Other  Obesity   Renal/GU negative Renal ROS     Musculoskeletal negative musculoskeletal ROS (+)   Abdominal   Peds  Hematology  (+) Blood dyscrasia, anemia , Plt 296k   Anesthesia Other Findings Day of surgery medications reviewed with the patient.  Reproductive/Obstetrics (+) Pregnancy                             Anesthesia Physical Anesthesia Plan  ASA: 2  Anesthesia Plan: Epidural   Post-op Pain Management:    Induction:   PONV Risk Score and Plan: 2 and Treatment may vary due to age or medical condition  Airway Management Planned: Natural Airway  Additional Equipment:   Intra-op Plan:   Post-operative Plan:   Informed Consent: I have reviewed the patients History and Physical, chart, labs and discussed the procedure including the risks, benefits and alternatives for the proposed anesthesia with the patient or authorized representative who has indicated his/her understanding and acceptance.     Dental advisory given  Plan Discussed with:   Anesthesia Plan Comments: (Patient identified. Risks/Benefits/Options discussed with patient including but not limited to bleeding, infection, nerve damage, paralysis, failed block, incomplete pain control, headache, blood pressure changes, nausea, vomiting,  reactions to medication both or allergic, itching and postpartum back pain. Confirmed with bedside nurse the patient's most recent platelet count. Confirmed with patient that they are not currently taking any anticoagulation, have any bleeding history or any family history of bleeding disorders. Patient expressed understanding and wished to proceed. All questions were answered. )        Anesthesia Quick Evaluation

## 2020-10-06 NOTE — Anesthesia Procedure Notes (Signed)
Epidural Patient location during procedure: OB Start time: 10/06/2020 9:25 AM End time: 10/06/2020 9:32 AM  Staffing Anesthesiologist: Cecile Hearing, MD Performed: anesthesiologist   Preanesthetic Checklist Completed: patient identified, IV checked, risks and benefits discussed, monitors and equipment checked, pre-op evaluation and timeout performed  Epidural Patient position: sitting Prep: DuraPrep Patient monitoring: blood pressure and continuous pulse ox Approach: midline Location: L3-L4 Injection technique: LOR air  Needle:  Needle type: Tuohy  Needle gauge: 17 G Needle length: 9 cm Needle insertion depth: 7 cm Catheter size: 19 Gauge Catheter at skin depth: 13 cm Test dose: negative and Other (1% Lidocaine)  Additional Notes Patient identified.  Risk benefits discussed including failed block, incomplete pain control, headache, nerve damage, paralysis, blood pressure changes, nausea, vomiting, reactions to medication both toxic or allergic, and postpartum back pain.  Patient expressed understanding and wished to proceed.  All questions were answered.  Sterile technique used throughout procedure and epidural site dressed with sterile barrier dressing. No paresthesia or other complications noted. The patient did not experience any signs of intravascular injection such as tinnitus or metallic taste in mouth nor signs of intrathecal spread such as rapid motor block. Please see nursing notes for vital signs. Reason for block:procedure for pain

## 2020-10-06 NOTE — Progress Notes (Signed)
Patient ID: Katherine Velasquez, female   DOB: Nov 11, 1995, 25 y.o.   MRN: [id number] Still comfortable Not feeling any pain with contractions  Vitals:   10/06/20 0600 10/06/20 0630 10/06/20 0700 10/06/20 0732  BP: 113/65 104/72 112/68 106/68  Pulse: 63 93 67 63  Resp: 13  14 16   Temp:      TempSrc:      Weight:      Height:       FHR reactive  UCs every  Cervical exam deferred Was too high to AROM earlier

## 2020-10-06 NOTE — Progress Notes (Signed)
IFSE removed with tip intact 

## 2020-10-06 NOTE — Progress Notes (Signed)
Labor Progress Note Kenlie Seki is a 25 y.o. G1P0000 at [redacted]w[redacted]d presented for postdates IOL.  S: Pt now comfortable with epidural in place. No concerns at this time.  O:  BP 103/65   Pulse 60   Temp 97.6 F (36.4 C) (Oral)   Resp 17   Ht 5\' 4"  (1.626 m)   Wt 95.7 kg   LMP 11/28/2019   SpO2 99%   BMI 36.22 kg/m  EFM: baseline 125/moderate variability/+accels/early decels Toco: contractions every 2-3 min  CVE: Dilation: 7 Effacement (%): 90 Station: 0 Presentation: Vertex Exam by:: Dr [email or web address]   A&P: 25 y.o. G1P0000 [redacted]w[redacted]d presented for postdates IOL. #Labor: Progressing well. Now s/p AROM at 1245. #Pain: epidural in place #FWB: Category 1 strip #GBS positive; now adequate for antibiotics  [redacted]w[redacted]d, MD 1:31 PM

## 2020-10-06 NOTE — Plan of Care (Signed)
Pt demonstrated understanding 

## 2020-10-06 NOTE — Discharge Summary (Signed)
Postpartum Discharge Summary      Patient Name: Katherine Velasquez DOB: 1996/02/05 MRN: [id number]  Date of admission: 10/05/2020 Delivery date:10/06/2020  Delivering provider: Randa Ngo  Date of discharge: 10/07/2020  Admitting diagnosis: Post term pregnancy over 40 weeks [O48.0] Intrauterine pregnancy: [redacted]w[redacted]d    Secondary diagnosis:  Principal Problem:   Vacuum-assisted vaginal delivery Active Problems:   Severe anemia   Pregnant   Post term pregnancy over 40 weeks  Additional problems: as noted above  Discharge diagnosis: Vacuum-assisted vaginal delivery for NRFHTs                                        Post partum procedures: None Augmentation: Pitocin and IP Foley Complications: Non-reassuring fetal heart tones with need for VAVD  Hospital course: Induction of Labor With Vaginal Delivery   25y.o. yo G1P1001 at 434w1das admitted to the hospital 10/05/2020 for induction of labor.  Indication for induction: Postdates.  Labor course notable for non-reassuring fetal heart tones in second stage with need for vacuum-assisted delivery. Delivery was otherwise uncomplicated as noted below. Membrane Rupture Time/Date: 12:48 PM ,10/06/2020   Delivery Method:Vaginal, Vacuum (Extractor)  Episiotomy: None  Lacerations:  2nd degree;Perineal  Details of delivery can be found in separate delivery note.  Patient had a routine postpartum course. Patient is discharged home 10/07/20.  Newborn Data: Birth date:10/06/2020  Birth time:2:35 PM  Gender:Female  Living status:Living  Apgars:6 ,9  Weight:3005 g   Magnesium Sulfate received: No BMZ received: No Rhophylac:N/A MMNFA:OZHYQMVrior to discharge T-DaP:offered prior to discharge Flu: N/A Transfusion:No  Physical exam  Vitals:   10/06/20 1917 10/06/20 2300 10/07/20 0328 10/07/20 0643  BP: 107/69 111/74 (!) 113/58 102/68  Pulse: 78 72 80 73  Resp: _0 Temp: 98.1 F (36.7 C) 97.8 F (36.6 C) 97.7 F (36.5 C) 97.6 F  (36.4 C)  TempSrc: Oral Oral Oral Oral  SpO2: 99% 99% 99% 100%  Weight:      Height:       General: alert, cooperative, and no distress Lochia: appropriate Uterine Fundus: firm Incision: N/A DVT Evaluation: No evidence of DVT seen on physical exam. No significant calf/ankle edema. Labs: Lab Results  Component Value Date   WBC 9.3 10/05/2020   HGB 10.8 (L) 10/05/2020   HCT 34.2 (L) 10/05/2020   MCV 91.7 10/05/2020   PLT 296 10/05/2020   CMP Latest Ref Rng & Units 04/12/2019  Glucose 70 - 99 mg/dL 98  BUN 6 - 20 mg/dL 10  Creatinine 0.44 - 1.00 mg/dL 0.71  Sodium 135 - 145 mmol/L 134(L)  Potassium 3.5 - 5.1 mmol/L 3.9  Chloride 98 - 111 mmol/L 102  CO2 22 - 32 mmol/L 23  Calcium 8.9 - 10.3 mg/dL 9.0  Total Protein 6.5 - 8.1 g/dL 7.9  Total Bilirubin 0.3 - 1.2 mg/dL 0.4  Alkaline Phos 38 - 126 U/L 77  AST 15 - 41 U/L 19  ALT 0 - 44 U/L 22   Edinburgh Score: No flowsheet data found.   After visit meds:  Allergies as of 10/07/2020   No Known Allergies      Medication List     STOP taking these medications    Iron Tabs   promethazine 12.5 MG tablet Commonly known as: PHENERGAN       TAKE these medications  ibuprofen 600 MG tablet Commonly known as: ADVIL Take 1 tablet (600 mg total) by mouth every 6 (six) hours.   prenatal multivitamin Tabs tablet Take 1 tablet by mouth daily at 12 noon.         Discharge home in stable condition Infant Feeding: Breast Infant Disposition:home with mother Discharge instruction: per After Visit Summary and Postpartum booklet. Activity: Advance as tolerated. Pelvic rest for 6 weeks.  Diet: routine diet Future Appointments:No future appointments. Follow up Visit: Pt instructed to contact [facility] to schedule postpartum appointment.  Please schedule this patient for a In person postpartum visit in 6 weeks with the following provider: Any provider. Additional Postpartum F/U: none   Low risk pregnancy complicated  by:  vacuum-assisted vaginal delivery for NRFHTs Delivery mode:  Vaginal, Vacuum (Extractor)  Anticipated Birth Control:   depo  10/07/2020 [name], CNM

## 2020-10-07 MED ORDER — IBUPROFEN 600 MG PO TABS: 600.0000 mg | ORAL_TABLET | Freq: Four times a day (QID) | ORAL | 0 refills | Status: DC

## 2020-10-07 NOTE — Anesthesia Postprocedure Evaluation (Signed)
Anesthesia Post Note  Patient: Zury Fazzino  Procedure(s) Performed: AN AD HOC LABOR EPIDURAL     Patient location during evaluation: Mother Baby Anesthesia Type: Epidural Level of consciousness: awake and alert Pain management: pain level controlled Vital Signs Assessment: post-procedure vital signs reviewed and stable Respiratory status: spontaneous breathing, nonlabored ventilation and respiratory function stable Cardiovascular status: stable Postop Assessment: no headache, no backache, epidural receding, no apparent nausea or vomiting, patient able to bend at knees, adequate PO intake and able to ambulate Anesthetic complications: no   No notable events documented.  Last Vitals:  Vitals:   10/07/20 0328 10/07/20 0643  BP: (!) 113/58 102/68  Pulse: 80 73  Resp: 18 16  Temp: 36.5 C 36.4 C  SpO2: 99% 100%    Last Pain:  Vitals:   10/07/20 0643  TempSrc: Oral  PainSc: 0-No pain   Pain Goal: Patients Stated Pain Goal: 3 (10/05/20 1709)                 Laban Emperor

## 2020-10-07 NOTE — Lactation Note (Addendum)
This note was copied from a baby's chart. Lactation Consultation Note  Patient Name: Katherine Velasquez NIOEV'O Date: 10/07/2020 Reason for consult: Follow-up assessment;1st time breastfeeding;Primapara;Term Age:23 hours 3% weight loss.  LC in to visit with P1 Mom and FOB of term infant.  Mom has been using a hand pump and expressing colostrum and spoon feeding baby due to difficult time latching baby to the breast.  LC worked with Mom and baby trying in laid back prone position.  RN stated that baby would NOT open his mouth.  In prone position, baby opened wide.  Assisted Mom in sandwiching her breast.  Mom has a large diameter erect nipple.  Colostrum easily expressed onto nipple.  After a deep latch in laid back position, sat Mom more upright with pillow under baby and tried repeatedly to help baby sustain a deep latch to breast.  Baby initially latches with LC sandwiching breast, but then he thrusts his tongue and pushes nipple out of his mouth, and rooting again.  Tried football holds on both breasts.  Baby able to attain a latch for a minute and then backs off breast onto nipple.  Initiated a 24 mm nipple shield to see if this would help baby sustain a deep latch.  LC instilled 7 ml colostrum (that Mom had hand pumped prior) and baby became more rhythmically sucking, but tended to push nipple out of his mouth.   LC set up DEBP and assisted Mom to pump for first time.  24 mm flanges appear to be correct fit presently.  Mom expresses well with hand pump, and may continue doing this.    Plan- 1- Keep baby STS as much as possible 2- Offer breast with any cue, asking for help prn 3- May use nipple shield to assist with baby latching after trying without. 4- Pump both breasts 15 mins or use hand pump for feeding baby 7-12 ml colostrum after breastfeeding, unless he is contented after a good feeding.  RN aware of plan.  Maternal Data Has patient been taught Hand Expression?: Yes Does the  patient have breastfeeding experience prior to this delivery?: No  Feeding Mother's Current Feeding Choice: Breast Milk  LATCH Score Latch: Repeated attempts needed to sustain latch, nipple held in mouth throughout feeding, stimulation needed to elicit sucking reflex.  Audible Swallowing: A few with stimulation  Type of Nipple: Everted at rest and after stimulation  Comfort (Breast/Nipple): Soft / non-tender  Hold (Positioning): Assistance needed to correctly position infant at breast and maintain latch.  LATCH Score: 7   Lactation Tools Discussed/Used Tools: Nipple Shields;Pump;Flanges Nipple shield size: 24 Flange Size: 24 Breast pump type: Double-Electric Breast Pump Pump Education: Setup, frequency, and cleaning;Milk Storage Reason for Pumping: support milk supply  Interventions Interventions: Breast feeding basics reviewed;Assisted with latch;Skin to skin;Breast massage;Hand express;Breast compression;Adjust position;Support pillows;Position options;Expressed milk;Hand pump;DEBP;Education  Discharge Pump: Personal (DEBP, unsure of make)  Consult Status Consult Status: Follow-up Date: 10/08/20 Follow-up type: In-patient    Judee Clara 10/07/2020, 2:44 PM

## 2020-10-07 NOTE — Progress Notes (Signed)
Katherine Velasquez  Post Partum Day Once:S/P VAVD  Subjective: Patient up ad lib, denies syncope or dizziness. Reports consuming regular diet without issues and denies N/V. Denies issues with urination and reports bleeding is "good."  Patient is breastfeeding and reports "its a journey."  Desires Depo for postpartum contraception.  Pain is being appropriately managed with use of tylenol and motrin.  Objective: Vitals:   10/06/20 1810 10/06/20 1917 10/06/20 2300 10/07/20 0328  BP: 122/87 107/69 111/74 (!) 113/58  Pulse: 68 78 72 80  Resp: 18 18 18 18   Temp: 98.5 F (36.9 C) 98.1 F (36.7 C) 97.8 F (36.6 C) 97.7 F (36.5 C)  TempSrc: Oral Oral Oral Oral  SpO2: 100% 99% 99% 99%  Weight:      Height:       Recent Labs    10/05/20 1421  HGB 10.8*  HCT 34.2*    Physical Exam:  General: cooperative, fatigued, and no distress Mood/Affect: Appropriate/Appropriate Lungs: clear to auscultation, no wheezes, rales or rhonchi, symmetric air entry.  Heart: normal rate and regular rhythm. Breast: not examined. Abdomen:  + bowel sounds, Soft, Nontender Uterine Fundus: firm, U/-3 Lochia: appropriate Laceration: Notn assessed Skin: Warm, Dry DVT Evaluation: No evidence of DVT seen on physical exam. No significant calf/ankle edema.  Assessment S/P Vaginal Delivery-[employer] Normal [employer] BreastFeeding Rubella NI Desires Infant Circ  Plan: -Encouragement given regarding breastfeeding journey. -Reviewed availability of lactation consults.  -Informed that circumcision will occur today or tomorrow. -Plan for depo and MMR prior to discharge -Continue current care -L*D team to be updated on patient status   Katherine Conners, MSN, CNM 10/07/2020, 6:32 AM

## 2020-10-07 NOTE — Progress Notes (Signed)
Encouraged patient to get some sleep.

## 2020-10-07 NOTE — Progress Notes (Addendum)
Parent request formula to supplement breast feeding due to mom's exhaustion and frustration with latching baby. Parents have been informed of small tummy size of newborn, taught hand expression and understands the possible consequences of formula to the health of the infant. The possible consequences shared with patent include 1) Loss of confidence in breastfeeding 2) Engorgement 3) Allergic sensitization of baby (asthma/allergies) and 4) decreased milk supply for mother. After discussion of the above the mother decided to proceed with her decision to supplement with formula.The  tool used to give formula supplement will be a bottle of Similac. All breastfeeding support methods were implemented, including hand expression into a spoon, hand pumping, using the DEBP, and syringe feeding with baby latched with the nipple shield. Baby was observed to be still fussy at the end of the feeding.

## 2020-10-08 NOTE — Discharge Summary (Signed)
Postpartum Discharge Summary      Patient Name: Katherine Velasquez DOB: Apr 30, 1995 MRN: [id number]  Date of admission: 10/05/2020 Delivery date:10/06/2020  Delivering provider: Randa Ngo  Date of discharge: 10/08/2020  Admitting diagnosis: Post term pregnancy over 40 weeks [O48.0] Intrauterine pregnancy: [redacted]w[redacted]d    Secondary diagnosis:  Principal Problem:   Vacuum-assisted vaginal delivery Active Problems:   Severe anemia   Pregnant   Post term pregnancy over 40 weeks  Additional problems: as noted above  Discharge diagnosis: Vacuum-assisted vaginal delivery for NRFHTs                                        Post partum procedures: protect  None Augmentation: Pitocin and IP Foley Complications: Non-reassuring fetal heart tones with need for VAVD  Hospital course: Induction of Labor With Vaginal Delivery   25y.o. yo G1P1001 at 423w1das admitted to the hospital 10/05/2020 for induction of labor.  Indication for induction: Postdates.  Labor course notable for non-reassuring fetal heart tones in second stage with need for vacuum-assisted delivery. Delivery was otherwise uncomplicated as noted below. Membrane Rupture Time/Date: 12:48 PM ,10/06/2020   Delivery Method:Vaginal, Vacuum (Extractor)  Episiotomy: None  Lacerations:  2nd degree;Perineal  Details of delivery can be found in separate delivery note.  Patient had a routine postpartum course. Patient is discharged home 10/08/20.  Newborn Data: Birth date:10/06/2020  Birth time:2:35 PM  Gender:Female  Living status:Living  Apgars:6 ,9  Weight:3005 g   Magnesium Sulfate received: No BMZ received: No Rhophylac:N/A MMBXU:XYBFXOVrior to discharge T-DaP:offered prior to discharge Flu: N/A Transfusion:No  Physical exam  Vitals:   10/07/20 0643 10/07/20 1444 10/07/20 2011 10/08/20 0432  BP: 102/68 105/64 107/72 117/68  Pulse: 73 69 66 74  Resp: _0 Temp: 97.6 F (36.4 C) 97.8 F (36.6 C) 97.9 F (36.6 C) 98  F (36.7 C)  TempSrc: Oral Oral Oral Oral  SpO2: 100%   100%  Weight:      Height:       General: alert, cooperative, and no distress Lochia: appropriate Uterine Fundus: firm Incision: N/A DVT Evaluation: No evidence of DVT seen on physical exam. No significant calf/ankle edema. Labs: Lab Results  Component Value Date   WBC 9.3 10/05/2020   HGB 10.8 (L) 10/05/2020   HCT 34.2 (L) 10/05/2020   MCV 91.7 10/05/2020   PLT 296 10/05/2020   CMP Latest Ref Rng & Units 04/12/2019  Glucose 70 - 99 mg/dL 98  BUN 6 - 20 mg/dL 10  Creatinine 0.44 - 1.00 mg/dL 0.71  Sodium 135 - 145 mmol/L 134(L)  Potassium 3.5 - 5.1 mmol/L 3.9  Chloride 98 - 111 mmol/L 102  CO2 22 - 32 mmol/L 23  Calcium 8.9 - 10.3 mg/dL 9.0  Total Protein 6.5 - 8.1 g/dL 7.9  Total Bilirubin 0.3 - 1.2 mg/dL 0.4  Alkaline Phos 38 - 126 U/L 77  AST 15 - 41 U/L 19  ALT 0 - 44 U/L 22   Edinburgh Score: Edinburgh Postnatal Depression Scale Screening Tool 10/07/2020  I have been able to laugh and see the funny side of things. 0  I have looked forward with enjoyment to things. 1  I have blamed myself unnecessarily when things went wrong. 0  I have been anxious or worried for no good reason. 3  I have felt scared  or panicky for no good reason. 2  Things have been getting on top of me. 1  I have been so unhappy that I have had difficulty sleeping. 0  I have felt sad or miserable. 0  I have been so unhappy that I have been crying. 1  The thought of harming myself has occurred to me. 0  Edinburgh Postnatal Depression Scale Total 8     After visit meds:  Allergies as of 10/08/2020   No Known Allergies      Medication List     STOP taking these medications    promethazine 12.5 MG tablet Commonly known as: PHENERGAN       TAKE these medications    ibuprofen 600 MG tablet Commonly known as: ADVIL Take 1 tablet (600 mg total) by mouth every 6 (six) hours.   Iron Tabs Take 1 tablet by mouth daily.    prenatal multivitamin Tabs tablet Take 1 tablet by mouth daily at 12 noon.         Discharge home in stable condition Infant Feeding: Breast Infant Disposition:home with mother Discharge instruction: per After Visit Summary and Postpartum booklet. Activity: Advance as tolerated. Pelvic rest for 6 weeks.  Diet: routine diet Future Appointments:No future appointments. Follow up Visit: Pt instructed to contact [facility] to schedule postpartum appointment.  Please schedule this patient for a In person postpartum visit in 6 weeks with the following provider: Any provider. Additional Postpartum F/U: protect  none   Low risk pregnancy complicated by:  vacuum-assisted vaginal delivery for NRFHTs Delivery mode:  Vaginal, Vacuum (Extractor)  Anticipated Birth Control:   depo  10/08/2020 Darlina Rumpf, CNM

## 2020-10-18 ENCOUNTER — Telehealth (HOSPITAL_COMMUNITY): Payer: Self-pay | Admitting: *Deleted

## 2020-10-18 NOTE — Telephone Encounter (Signed)
Left message to return nurse call.  Duffy Rhody, RN 10/18/2020 at 11:16am

## 2021-03-08 IMAGING — US US MFM OB DETAIL+14 WK
1 series · 13 of 28 positions shown · non-contrast
Comparison: none

[Series 1: us mfm ob detail+14 wk · 137 acquisitions, 13 frames shown]
[im 6/137]
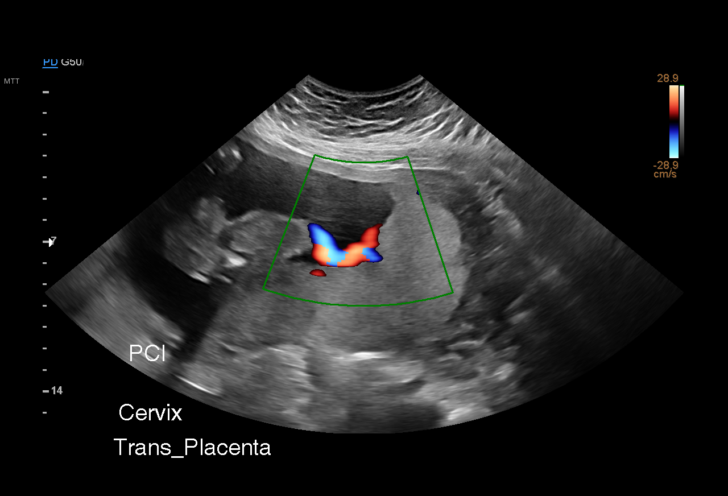
[im 16/137]
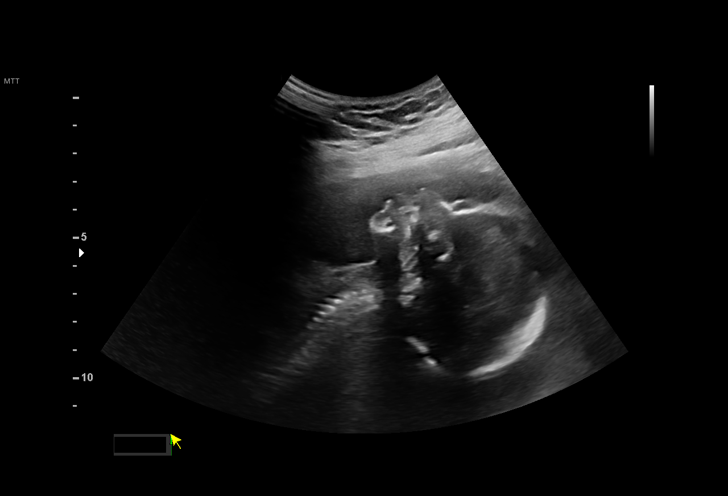
[im 26/137]
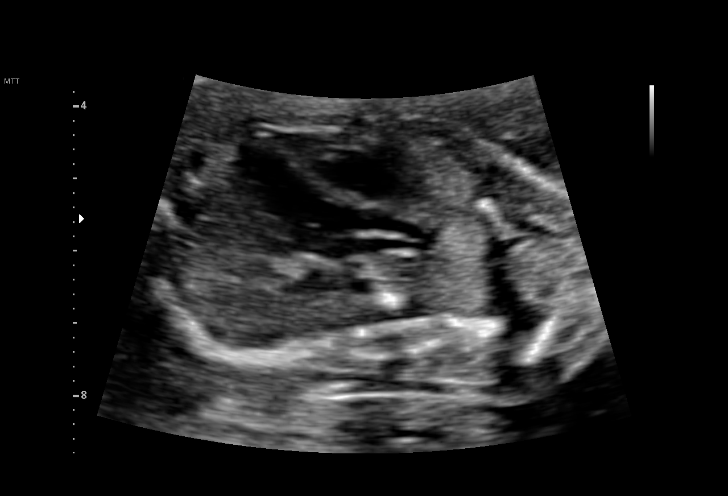
[im 36/137]
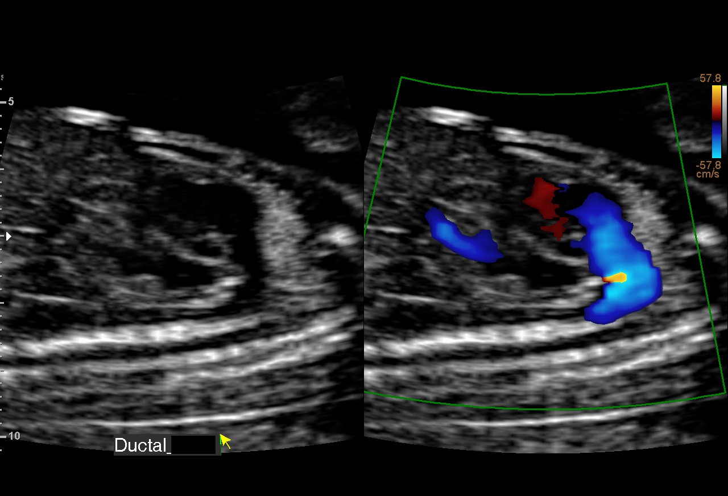
[im 46/137]
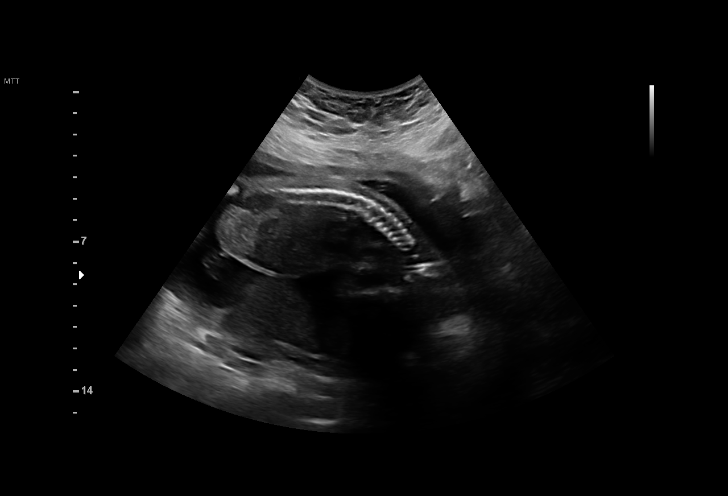
[im 56/137]
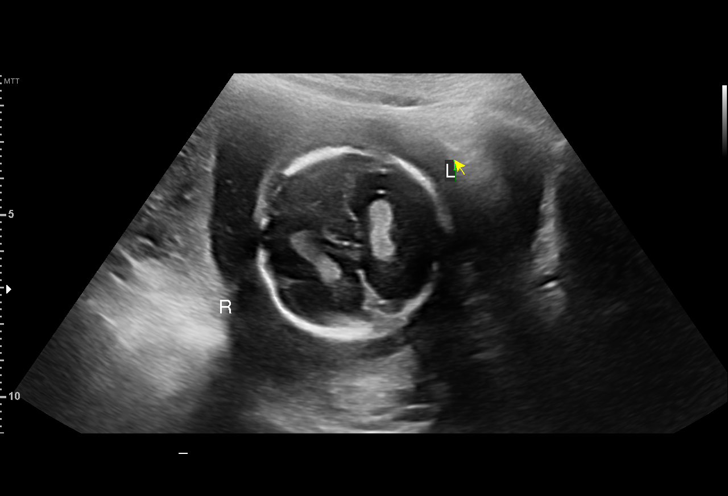
[im 71/137]
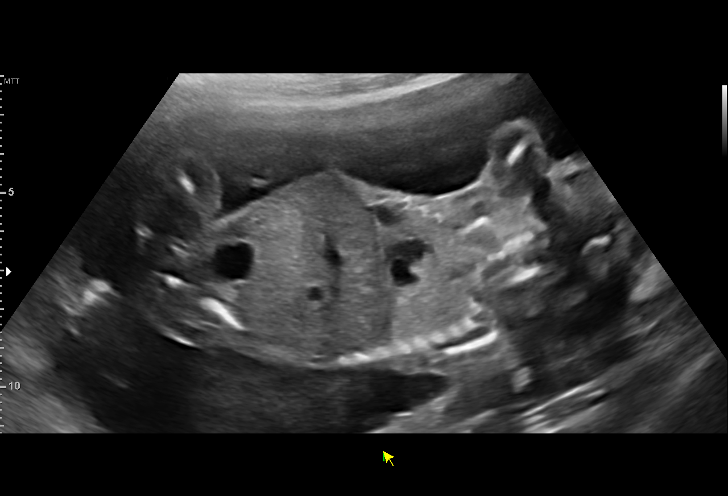
[im 81/137]
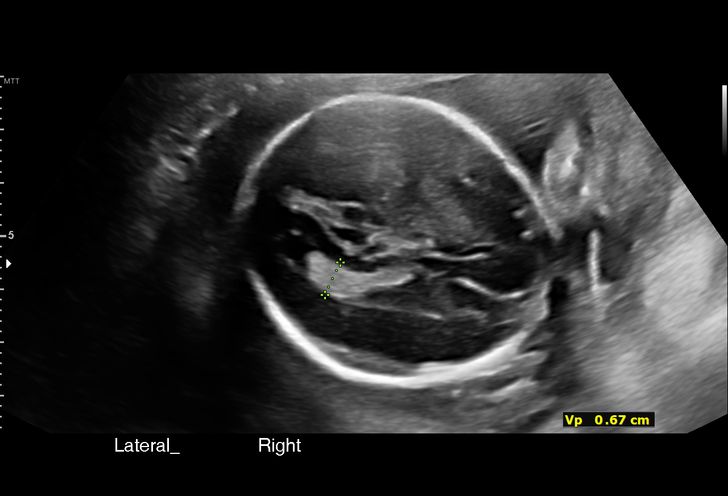
[im 91/137]
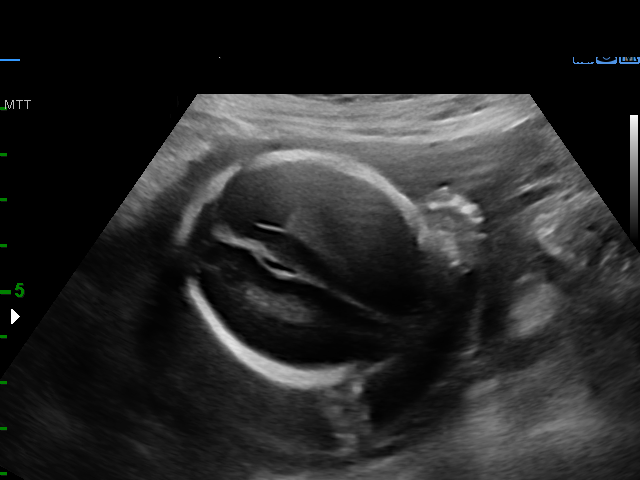
[im 101/137]
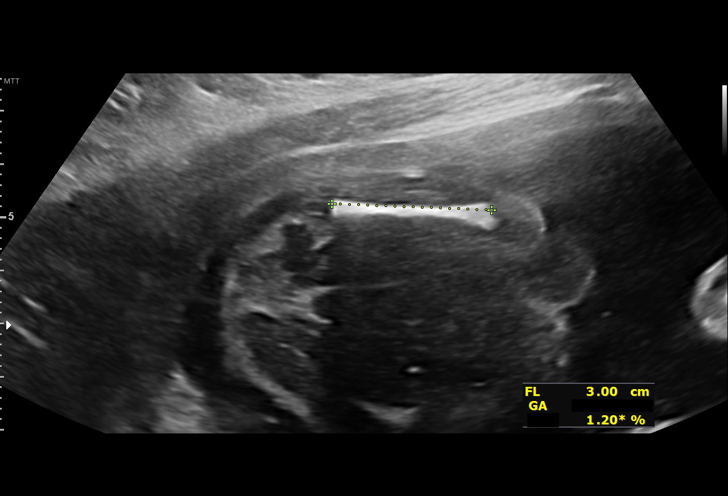
[im 111/137]
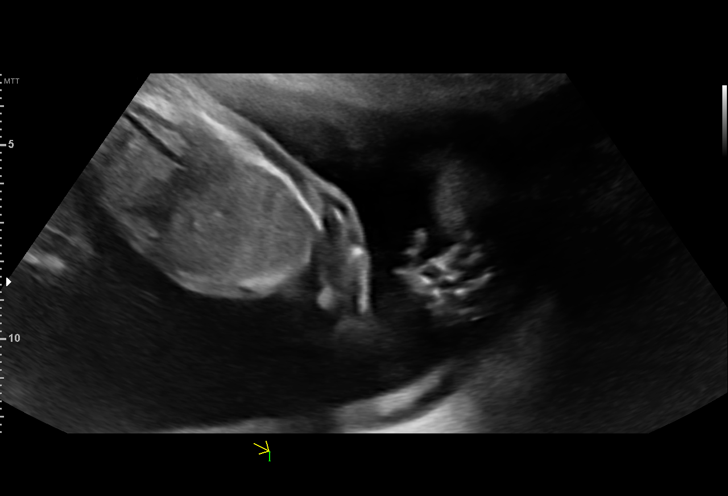
[im 121/137]
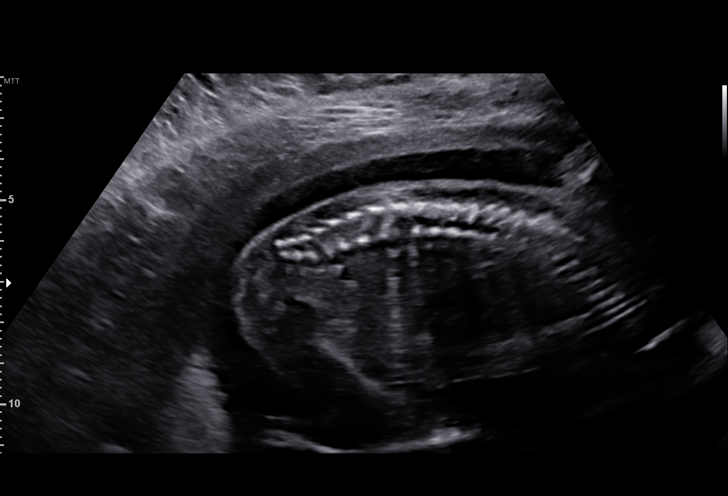
[im 131/137]
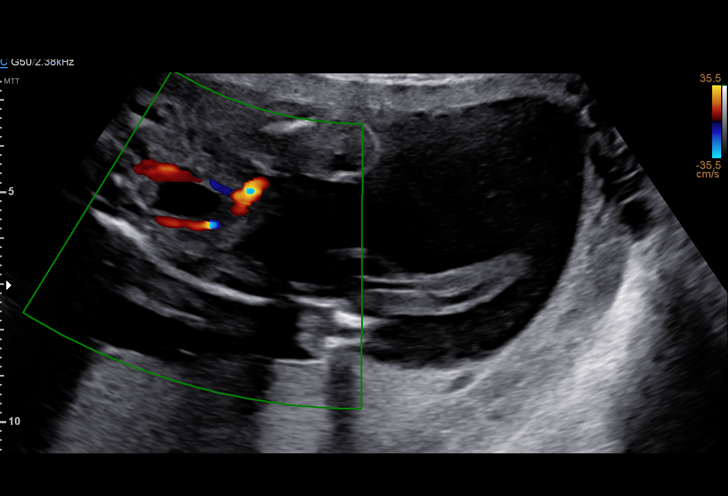

[13 of 28 positions shown; findings below may reference images not displayed]

[REDACTED]-
                                                            Faculty Physician
                   [id number]

Indications

 Abnormal biochemical finding on antenatal
 screening of mother
 Antenatal screening for elevated
 alphafetoprotein level (AFP)
 21 weeks gestation of pregnancy
 Encounter for antenatal screening for
 malformations
 Medical complication of pregnancy (History
 of Anemia with Blood Transfusion 4263)
Fetal Evaluation

 Num Of Fetuses:         1
 Fetal Heart Rate(bpm):  162
 Cardiac Activity:       Observed
 Presentation:           Cephalic
 Placenta:               Posterior Left Lateral
 P. Cord Insertion:      Visualized, central

 Amniotic Fluid
 AFI FV:      Within normal limits

                             Largest Pocket(cm)

Biometry

 BPD:      50.7  mm     G. Age:  21w 3d         45  %    CI:        77.58   %    70 - 86
                                                         FL/HC:      17.8   %    15.9 -
 HC:      182.2  mm     G. Age:  20w 4d         11  %    HC/AC:      1.15        1.06 -
 AC:      158.9  mm     G. Age:  21w 0d         29  %    FL/BPD:     64.1   %
 FL:       32.5  mm     G. Age:  20w 1d          8  %    FL/AC:      20.5   %    20 - 24
 HUM:      29.5  mm     G. Age:  19w 5d        < 5  %
 CER:      21.8  mm     G. Age:  20w 4d         36  %

 LV:        6.7  mm
 CM:        5.3  mm

 Est. FW:     367  gm    0 lb 13 oz      12  %
OB History

 Blood Type:   O+
 Gravidity:    1
Gestational Age

 LMP:           25w 0d        Date:  11/28/19                 EDD:   09/03/20
 U/S Today:     20w 6d                                        EDD:   10/02/20
 Best:          21w 3d     Det. By:  Early Ultrasound         EDD:   09/28/20
                                     (04/02/20)
Anatomy

 Cranium:               Appears normal         Aortic Arch:            Appears normal
 Cavum:                 Not well visualized    Ductal Arch:            Appears normal
 Ventricles:            Appears normal         Diaphragm:              Appears normal
 Choroid Plexus:        Appears normal         Stomach:                Appears normal, left
                                                                       sided
 Cerebellum:            Appears normal         Abdomen:                Appears normal
 Posterior Fossa:       Appears normal         Abdominal Wall:         Appears nml (cord
                                                                       insert, abd wall)
 Nuchal Fold:           Not applicable (>20    Cord Vessels:           Appears normal (3
                        wks GA)                                        vessel cord)
 Face:                  Appears normal         Kidneys:                Appear normal
                        (orbits and profile)
 Lips:                  Appears normal         Bladder:                Appears normal
 Thoracic:              Appears normal         Spine:                  Appears normal
 Heart:                 Appears normal; EIF    Upper Extremities:      Appears normal
 RVOT:                  Appears normal         Lower Extremities:      Appears normal
 LVOT:                  Appears normal

 Other:  Parents do not wish to know sex of fetus at this time.  Fetus appears
         to be a male. Nasal bone visualized. Heels/feet and open hands/5th
         digits visualized.
Cervix Uterus Adnexa

 Cervix
 Length:            3.7  cm.
 Normal appearance by transabdominal scan.
 Uterus
 No abnormality visualized.

 Right Ovary
 Within normal limits.

 Left Ovary
 Within normal limits.

 Cul De Sac
 No free fluid seen.

 Adnexa
 No abnormality visualized.
Comments

 This patient was seen for a detailed ultrasound due to an
 elevated MSAFP of 2.06 MoM.  She reports no complications
 in her current pregnancy.  She denies any significant past
 medical or family history.
 Her quad screen indicated a Down syndrome and trisomy 18
 risk of less than 1 in [DATE].
 The patient was advised of the ultrasound findings that failed
 to reveal an anatomical cause for the increased MSAFP.
 There were no sonographic signs of spina bifida or an
 anterior abdominal wall defect noted today.  She was advised
 regarding the limitations of ultrasound in the detection of all
 anomalies and that it will diagnose approximately 90% of
 neural tube defects.  The association of an elevated MSAFP
 with placental dysfunction which may manifest later in her
 pregnancy as fetal growth restriction along with with other
 adverse pregnancy outcomes such as fetal demise was
 discussed.
 An intracardiac echogenic focus was noted in the left
 ventricle of the fetal heart.  The small association between an
 echogenic focus and Down syndrome was discussed.
 Due to the echogenic focus noted today and the elevated
 MSAFP, the patient was offered and declined an
 amniocentesis today for definitive diagnosis of fetal
 aneuploidy and spina bifida.
 Due to the elevated MSAFP, we will continue to follow her
 with monthly growth ultrasounds.
 A follow-up exam was scheduled in 4 weeks.

## 2021-04-20 ENCOUNTER — Ambulatory Visit (HOSPITAL_COMMUNITY)
Admission: EM | Admit: 2021-04-20 | Discharge: 2021-04-20 | Disposition: A | Discharge status: Home / Self Care | Payer: Medicaid Other | Attending: Emergency Medicine | Admitting: Emergency Medicine

## 2021-04-20 ENCOUNTER — Other Ambulatory Visit: Payer: Self-pay

## 2021-04-20 DIAGNOSIS — Z1152 Encounter for screening for COVID-19: Secondary | ICD-10-CM | POA: Diagnosis not present

## 2021-04-20 DIAGNOSIS — J069 Acute upper respiratory infection, unspecified: Secondary | ICD-10-CM | POA: Insufficient documentation

## 2021-04-20 DIAGNOSIS — R110 Nausea: Secondary | ICD-10-CM | POA: Diagnosis not present

## 2021-04-20 LAB — POC INFLUENZA A AND B ANTIGEN (URGENT CARE ONLY)
INFLUENZA A ANTIGEN, POC: NEGATIVE
INFLUENZA B ANTIGEN, POC: NEGATIVE

## 2021-04-20 MED ORDER — BENZONATATE 100 MG PO CAPS: 100.0000 mg | ORAL_CAPSULE | Freq: Three times a day (TID) | ORAL | 0 refills | Status: DC | PRN

## 2021-04-20 MED ORDER — CETIRIZINE HCL 10 MG PO TABS: 10.0000 mg | ORAL_TABLET | Freq: Every day | ORAL | 0 refills | Status: DC

## 2021-04-20 MED ORDER — ONDANSETRON HCL 4 MG PO TABS: 4.0000 mg | ORAL_TABLET | Freq: Three times a day (TID) | ORAL | 0 refills | Status: DC | PRN

## 2021-04-20 NOTE — Discharge Instructions (Addendum)
Influenza test was negative  COVID testing ordered.  It will take between 1-4 days for test results.  Someone will contact you regarding abnormal results.    Get plenty of rest and push fluids Tessalon Perles prescribed for cough Zofran was prescribed for nausea zyrtec for nasal congestion, runny nose, and/or sore throat Use medications daily for symptom relief Use OTC medications like ibuprofen or tylenol as needed fever or pain Call or go to the ED if you have any new or worsening symptoms such as fever, worsening cough, shortness of breath, chest tightness, chest pain, turning blue, changes in mental status, etc..Marland Kitchen

## 2021-04-20 NOTE — ED Provider Notes (Signed)
[facility]   [id number] 04/20/21 Arrival Time: [place]   Chief Complaint  Patient presents with   Emesis    FEVER  BODY ACHES    SUBJECTIVE: History from: patient.  Katherine Velasquez is a 26 y.o. female who who scented to the urgent care for complaint of fever, vomiting, nasal congestion and body ache for the past 4 days.  Has a husband with the same symptom.  Denies recent travel.  Has tried OTC medication without relief.  Denies any alleviating or aggravating factors.  Denies previous symptoms in the past.   Denies  fatigue, sinus pain, rhinorrhea, sore throat, SOB, wheezing, chest pain, nausea, changes in bowel or bladder habits.     ROS: As per HPI.  All other pertinent ROS negative.      Past Medical History:  Diagnosis Date   Anemia    Medical history non-contributory    Past Surgical History:  Procedure Laterality Date   NO PAST SURGERIES     No Known Allergies No current facility-administered medications on file prior to encounter.   Current Outpatient Medications on File Prior to Encounter  Medication Sig Dispense Refill   ibuprofen (ADVIL) 600 MG tablet Take 1 tablet (600 mg total) by mouth every 6 (six) hours. 30 tablet 0   Iron TABS Take 1 tablet by mouth daily. 30 tablet 6   Prenatal Vit-Fe Fumarate-FA (PRENATAL MULTIVITAMIN) TABS tablet Take 1 tablet by mouth daily at 12 noon.     [DISCONTINUED] norgestimate-ethinyl estradiol (ORTHO-CYCLEN,SPRINTEC,PREVIFEM) 0.25-35 MG-MCG tablet Take 1 tablet by mouth daily. 1 Package 11   Social History   Socioeconomic History   Marital status: Single    Spouse name: Not on file   Number of children: Not on file   Years of education: Not on file   Highest education level: Not on file  Occupational History   Not on file  Tobacco Use   Smoking status: Never   Smokeless tobacco: Never  Vaping Use   Vaping Use: Never used  Substance and Sexual Activity   Alcohol use: No   Drug use: No   Sexual activity:  Never    Birth control/protection: None  Other Topics Concern   Not on file  Social History Narrative   Not on file   Social Determinants of Health   Financial Resource Strain: Not on file  Food Insecurity: Not on file  Transportation Needs: Not on file  Physical Activity: Not on file  Stress: Not on file  Social Connections: Not on file  Intimate Partner Violence: Not on file   Family History  Problem Relation Age of Onset   Arthritis Father     OBJECTIVE:  Vitals:   04/20/21 1103  Pulse: (!) 112  Resp: 18  Temp: 98.3 F (36.8 C)  TempSrc: Oral  SpO2: 100%     General appearance: alert; appears fatigued, but nontoxic; speaking in full sentences and tolerating own secretions HEENT: NCAT; Ears: EACs clear, TMs pearly gray; Eyes: PERRL.  EOM grossly intact. Sinuses: nontender; Nose: nares patent without rhinorrhea, Throat: oropharynx clear, tonsils non erythematous or enlarged, uvula midline  Neck: supple without LAD Lungs: unlabored respirations, symmetrical air entry; cough: mild ; no respiratory distress; CTAB Heart: regular rate and rhythm.  Radial pulses 2+ symmetrical bilaterally Skin: warm and dry Psychological: alert and cooperative; normal mood and affect  LABS:  Results for orders placed or performed during the hospital encounter of 04/20/21 (from the past 24 hour(s))  POC Influenza  A & B Ag (Urgent Care)     Status: None   Collection Time: 04/20/21 11:22 AM  Result Value Ref Range   INFLUENZA A ANTIGEN, POC NEGATIVE NEGATIVE   INFLUENZA B ANTIGEN, POC NEGATIVE NEGATIVE     ASSESSMENT & PLAN:  1. URI with cough and congestion   2. Nausea without vomiting   3. Encounter for screening for COVID-19     Meds ordered this encounter  Medications   benzonatate (TESSALON) 100 MG capsule    Sig: Take 1 capsule (100 mg total) by mouth 3 (three) times daily as needed for cough.    Dispense:  30 capsule    Refill:  0   ondansetron (ZOFRAN) 4 MG tablet     Sig: Take 1 tablet (4 mg total) by mouth every 8 (eight) hours as needed for nausea or vomiting.    Dispense:  20 tablet    Refill:  0   cetirizine (ZYRTEC ALLERGY) 10 MG tablet    Sig: Take 1 tablet (10 mg total) by mouth daily. Taken at nighttime to sleep    Dispense:  30 tablet    Refill:  0   Discharge instructions  Influenza test was negative  COVID testing ordered.  It will take between 1-4 days for test results.  Someone will contact you regarding abnormal results.    Get plenty of rest and push fluids Tessalon Perles prescribed for cough Zofran was prescribed for nausea zyrtec for nasal congestion, runny nose, and/or sore throat Use medications daily for symptom relief Use OTC medications like ibuprofen or tylenol as needed fever or pain Call or go to the ED if you have any new or worsening symptoms such as fever, worsening cough, shortness of breath, chest tightness, chest pain, turning blue, changes in mental status, etc...   Reviewed expectations re: course of current medical issues. Questions answered. Outlined signs and symptoms indicating need for more acute intervention. Patient verbalized understanding. After Visit Summary given.          Katherine Velasquez, [place] 04/20/21 1139

## 2021-04-20 NOTE — ED Triage Notes (Signed)
Pt presents for fever,vomiting and body aches x 3-4 days.

## 2021-04-21 LAB — SARS CORONAVIRUS 2 (TAT 6-24 HRS): SARS Coronavirus 2: NEGATIVE

## 2021-04-22 IMAGING — US US MFM OB FOLLOW-UP
1 series · 14 of 28 positions shown · non-contrast
Comparison: none

[Series 1: us mfm ob follow-up · 34 acquisitions, 14 frames shown]
[im 2/34]
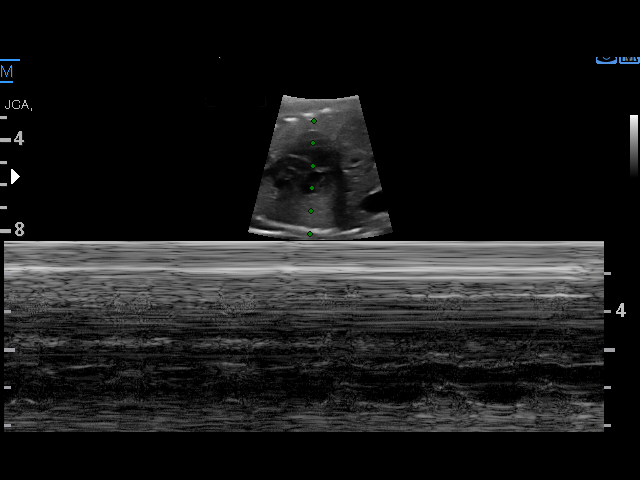
[im 4/34]
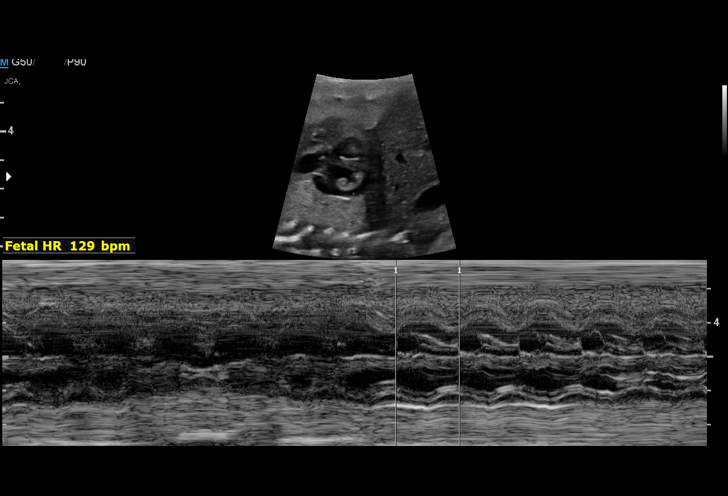
[im 7/34]
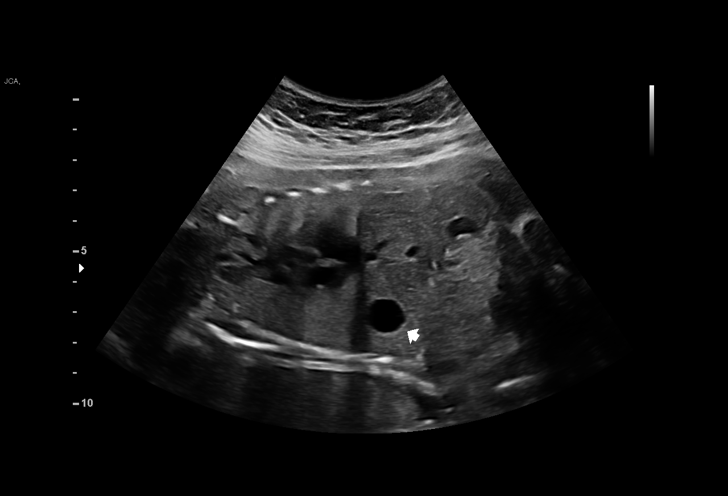
[im 9/34]
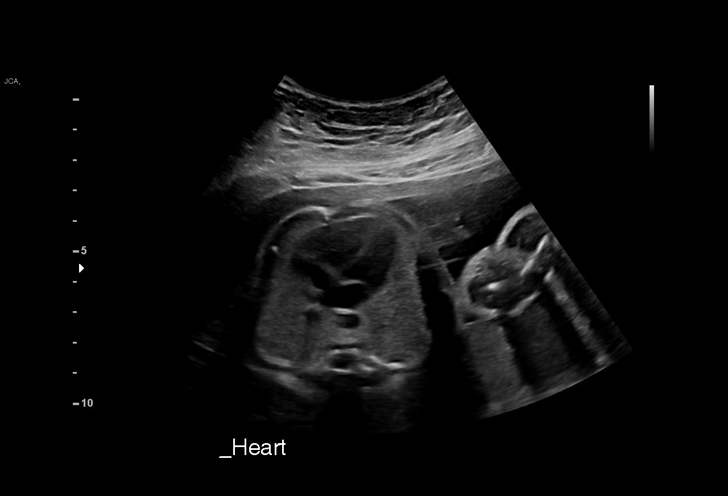
[im 12/34]
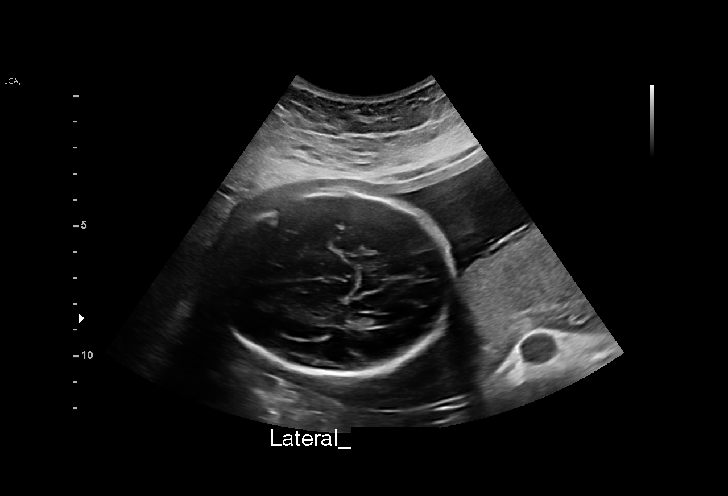
[im 14/34]
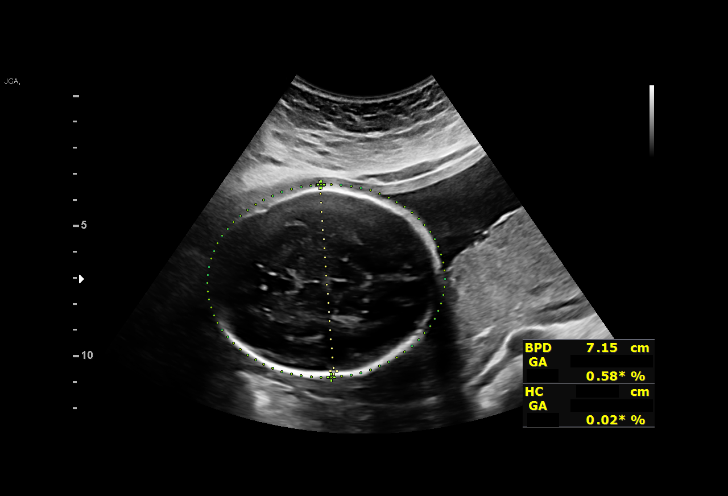
[im 16/34]
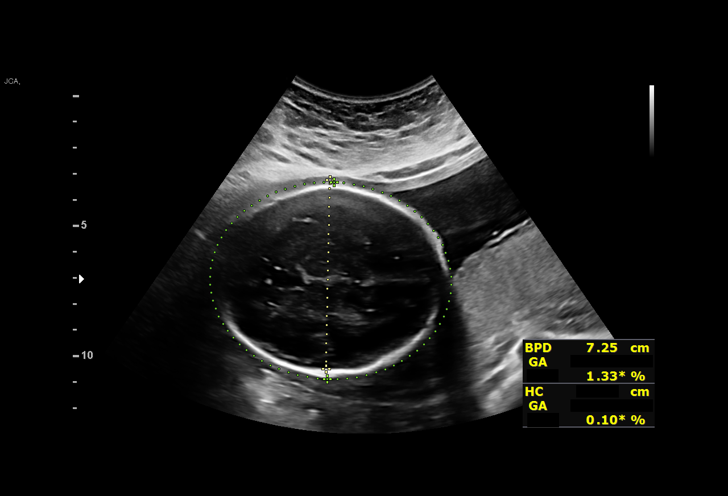
[im 19/34]
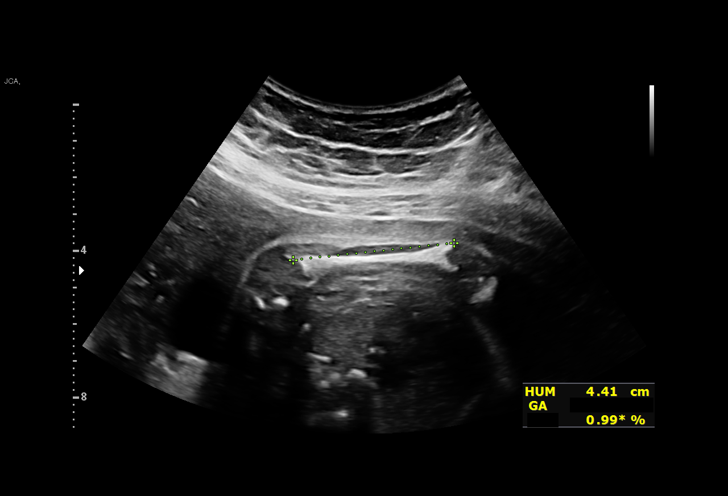
[im 21/34]
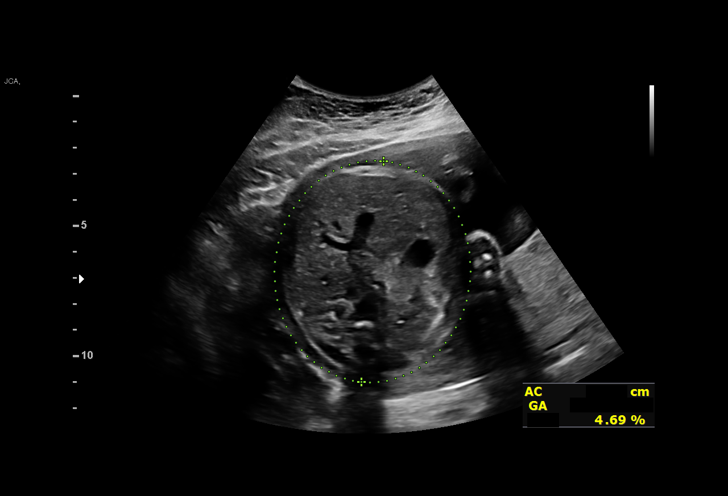
[im 24/34]
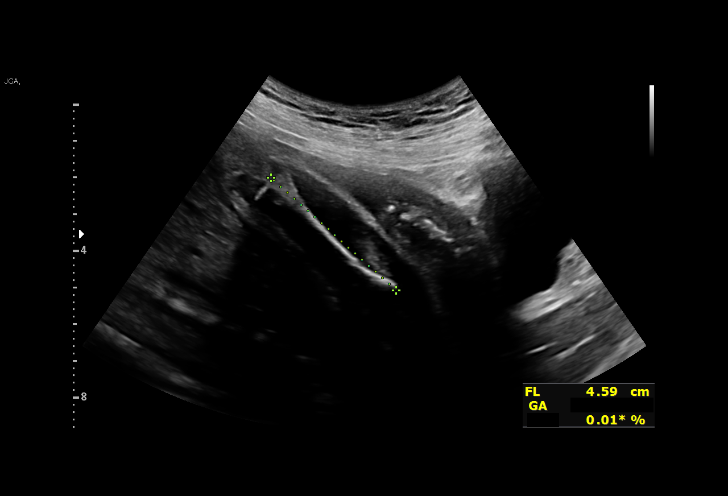
[im 26/34]
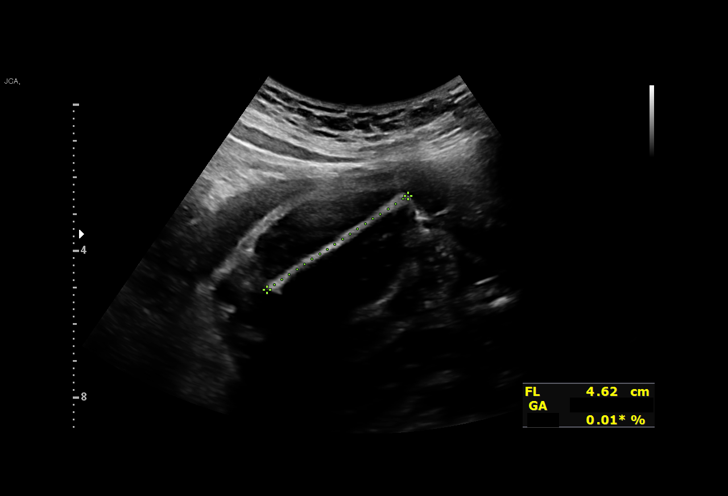
[im 29/34]
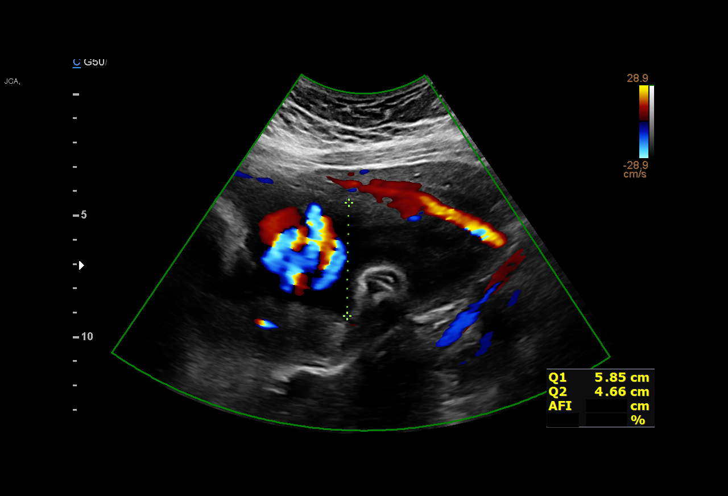
[im 31/34]
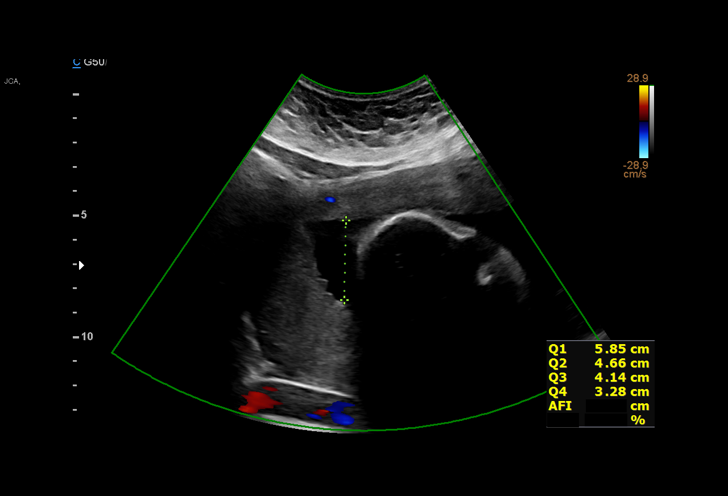
[im 34/34]
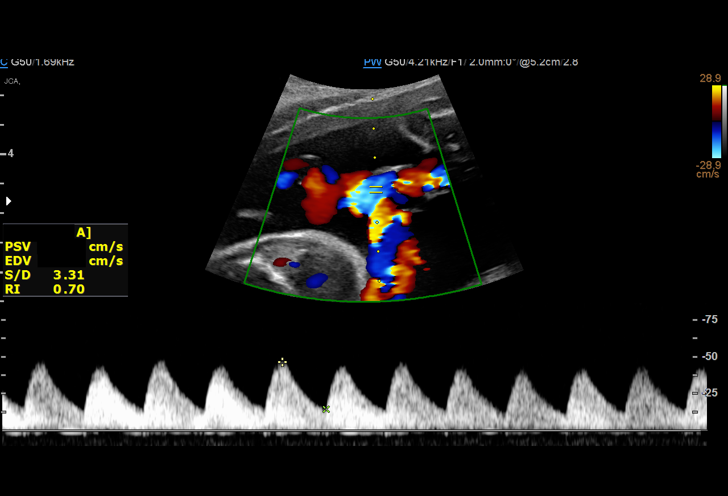

[14 of 28 positions shown; findings below may reference images not displayed]

Attending:        Benrabah Etoil      Secondary Phy.:   [REDACTED]
                                                            [REDACTED]-
                                                            Faculty Physician
                   [id number]

Indications

 Abnormal biochemical finding on antenatal
 screening of mother
 Antenatal screening for elevated
 alphafetoprotein level (AFP)
 Encounter for antenatal screening for
 malformations
 Medical complication of pregnancy (History
 of Anemia with Blood Transfusion 6755)
 27 weeks gestation of pregnancy
Fetal Evaluation

 Num Of Fetuses:         1
 Fetal Heart Rate(bpm):  130
 Cardiac Activity:       Observed
 Presentation:           Breech
 Placenta:               Posterior left Lateral
 P. Cord Insertion:      Visualized

 Amniotic Fluid
 AFI FV:      Within normal limits

 AFI Sum(cm)     %Tile       Largest Pocket(cm)
 17.93           68
 RUQ(cm)       RLQ(cm)       LUQ(cm)        LLQ(cm)

Biometry

 BPD:      72.2  mm     G. Age:  29w 0d         74  %    CI:        74.21   %    70 - 86
                                                         FL/HC:      17.3   %    18.8 -
 HC:      266.1  mm     G. Age:  29w 0d         56  %    HC/AC:      1.12        1.05 -
 AC:      238.1  mm     G. Age:  28w 1d         50  %    FL/BPD:     63.9   %    71 - 87
 FL:       46.1  mm     G. Age:  25w 2d        < 1  %    FL/AC:      19.4   %    20 - 24
 HUM:      44.1  mm     G. Age:  26w 1d          9  %

 LV:        4.1  mm
 Est. FW:    8380  gm      2 lb 5 oz     19  %
OB History

 Blood Type:   O+
 Gravidity:    1
Gestational Age

 LMP:           31w 3d        Date:  11/28/19                 EDD:   09/03/20
 U/S Today:     27w 6d                                        EDD:   09/28/20
 Best:          27w 6d     Det. By:  Early Ultrasound         EDD:   09/28/20
                                     (04/02/20)
Anatomy

 Cranium:               Appears normal         Aortic Arch:            Previously seen
 Cavum:                 Appears normal         Ductal Arch:            Previously seen
 Ventricles:            Appears normal         Diaphragm:              Appears normal
 Choroid Plexus:        Previously seen        Stomach:                Appears normal, left
                                                                       sided
 Cerebellum:            Previously seen        Abdomen:                Previously seen
 Posterior Fossa:       Previously seen        Abdominal Wall:         Previously seen
 Nuchal Fold:           Not applicable (>20    Cord Vessels:           Previously seen
                        wks GA)
 Face:                  Orbits and profile     Kidneys:                Appear normal
                        previously seen
 Lips:                  Previously seen        Bladder:                Appears normal
 Thoracic:              Appears normal         Spine:                  Previously seen
 Heart:                 Previously seen        Upper Extremities:      Previously seen
 RVOT:                  Previously seen        Lower Extremities:      Previously seen
 LVOT:                  Previously seen

 Other:  Parents do not wish to know sex of fetus at this time.  Fetus appears
         to be a male. Nasal bone visualized. Heels/feet and open hands/5th
         digits visualized.
Impression

 Follow up growth due to complete the fetal anatomy and for
 elevated AFP
 Normal interval growth with measurements consistent with
 dates
 Good fetal movement and amniotic fluid volume
 Anatomy completed today.
Recommendations

 Continue serial growth was scheduled in 4 weeks.

## 2021-07-31 ENCOUNTER — Ambulatory Visit (HOSPITAL_COMMUNITY)
Admission: EM | Admit: 2021-07-31 | Discharge: 2021-07-31 | Disposition: A | Discharge status: Home / Self Care | Payer: Medicaid Other | Attending: Internal Medicine | Admitting: Internal Medicine

## 2021-07-31 ENCOUNTER — Encounter (HOSPITAL_COMMUNITY): Payer: Self-pay

## 2021-07-31 DIAGNOSIS — R509 Fever, unspecified: Secondary | ICD-10-CM | POA: Insufficient documentation

## 2021-07-31 DIAGNOSIS — Z20822 Contact with and (suspected) exposure to covid-19: Secondary | ICD-10-CM | POA: Diagnosis not present

## 2021-07-31 LAB — POC INFLUENZA A AND B ANTIGEN (URGENT CARE ONLY)
INFLUENZA A ANTIGEN, POC: NEGATIVE
INFLUENZA B ANTIGEN, POC: NEGATIVE

## 2021-07-31 NOTE — Discharge Instructions (Signed)
Your rapid flu was negative.  COVID test is pending.  We will call if it is positive.  Please continue to monitor fever and treat as appropriate with ibuprofen and/or Tylenol.  Follow-up if symptoms persist or worsen. ?

## 2021-07-31 NOTE — ED Triage Notes (Signed)
Onset last night of subjective fever. No meds taken. No v/d, cough, congestion or runny nose. ?

## 2021-07-31 NOTE — ED Provider Notes (Signed)
?[facility] ? ? ? ?CSN: [id number] ?Arrival date & time: 07/31/21  1647 ? ? ?  ? ?History   ?Chief Complaint ?Chief Complaint  ?Patient presents with  ? Fever  ? ? ?HPI ?Katherine Velasquez is a 26 y.o. female.  ? ?Patient presents with fever and chills that started last night.  Tmax at home was 100.3.  Denies any known sick contacts.  Denies any associated upper respiratory symptoms, cough, sore throat, nausea, vomiting, diarrhea, abdominal pain.  Denies any associated urinary symptoms.  Denies any known exposure to STD.  Patient has not taken any medications to alleviate symptoms. ? ? ?Fever ? ?Past Medical History:  ?Diagnosis Date  ? Anemia   ? Medical history non-contributory   ? ? ?Patient Active Problem List  ? Diagnosis Date Noted  ? Vacuum-assisted vaginal delivery 10/06/2020  ? Pregnant 10/05/2020  ? Post term pregnancy over 40 weeks 10/05/2020  ? Menorrhagia 06/21/2015  ? Severe anemia 01/01/2015  ? Iron deficiency anemia due to chronic blood loss 05/24/2012  ? Menorrhagia with regular cycle 05/24/2012  ? ? ?Past Surgical History:  ?Procedure Laterality Date  ? NO PAST SURGERIES    ? ? ?OB History   ? ? Gravida  ?1  ? Para  ?1  ? Term  ?1  ? Preterm  ?0  ? AB  ?0  ? Living  ?1  ?  ? ? SAB  ?0  ? IAB  ?0  ? Ectopic  ?0  ? Multiple  ?0  ? Live Births  ?1  ?   ?  ?  ? ? ? ?Home Medications   ? ?Prior to Admission medications   ?Medication Sig Start Date End Date Taking? Authorizing Provider  ?benzonatate (TESSALON) 100 MG capsule Take 1 capsule (100 mg total) by mouth 3 (three) times daily as needed for cough. 04/20/21   Durward Parcel, FNP  ?cetirizine (ZYRTEC ALLERGY) 10 MG tablet Take 1 tablet (10 mg total) by mouth daily. Taken at nighttime to sleep 04/20/21   Durward Parcel, FNP  ?ibuprofen (ADVIL) 600 MG tablet Take 1 tablet (600 mg total) by mouth every 6 (six) hours. 10/07/20   Gerrit Heck, CNM  ?Iron TABS Take 1 tablet by mouth daily. 01/02/15   Edwinna Areola, DO  ?ondansetron  (ZOFRAN) 4 MG tablet Take 1 tablet (4 mg total) by mouth every 8 (eight) hours as needed for nausea or vomiting. 04/20/21   Avegno, Zachery Dakins, FNP  ?Prenatal Vit-Fe Fumarate-FA (PRENATAL MULTIVITAMIN) TABS tablet Take 1 tablet by mouth daily at 12 noon.    [provider]  ?norgestimate-ethinyl estradiol (ORTHO-CYCLEN,SPRINTEC,PREVIFEM) 0.25-35 MG-MCG tablet Take 1 tablet by mouth daily. 06/14/12 01/23/13  Constant, Gigi Gin, MD  ? ? ?Family History ?Family History  ?Problem Relation Age of Onset  ? Arthritis Father   ? ? ?Social History ?Social History  ? ?Tobacco Use  ? Smoking status: Never  ? Smokeless tobacco: Never  ?Vaping Use  ? Vaping Use: Never used  ?Substance Use Topics  ? Alcohol use: No  ? Drug use: No  ? ? ? ?Allergies   ?Patient has no known allergies. ? ? ?Review of Systems ?Review of Systems ?Per HPI ? ?Physical Exam ?Triage Vital Signs ?ED Triage Vitals  ?Enc Vitals Group  ?   BP 07/31/21 1729 118/84  ?   Pulse Rate 07/31/21 1729 78  ?   Resp 07/31/21 1729 18  ?   Temp 07/31/21 1729 98 ?  F (36.7 ?C)  ?   Temp Source 07/31/21 1729 Oral  ?   SpO2 07/31/21 1729 99 %  ?   Weight --   ?   Height --   ?   Head Circumference --   ?   Peak Flow --   ?   Pain Score 07/31/21 1730 0  ?   Pain Loc --   ?   Pain Edu? --   ?   Excl. in GC? --   ? ?No data found. ? ?Updated Vital Signs ?BP 118/84 (BP Location: Right Arm)   Pulse 78   Temp 98 ?F (36.7 ?C) (Oral)   Resp 18   LMP 07/28/2021 (Exact Date)   SpO2 99%   Breastfeeding No  ? ?Visual Acuity ?Right Eye Distance:   ?Left Eye Distance:   ?Bilateral Distance:   ? ?Right Eye Near:   ?Left Eye Near:    ?Bilateral Near:    ? ?Physical Exam ?Constitutional:   ?   General: She is not in acute distress. ?   Appearance: Normal appearance. She is not toxic-appearing or diaphoretic.  ?HENT:  ?   Head: Normocephalic and atraumatic.  ?   Right Ear: Tympanic membrane and ear canal normal.  ?   Left Ear: Tympanic membrane and ear canal normal.  ?   Nose: Nose  normal.  ?   Mouth/Throat:  ?   Mouth: Mucous membranes are moist.  ?   Pharynx: No posterior oropharyngeal erythema.  ?Eyes:  ?   Extraocular Movements: Extraocular movements intact.  ?   Conjunctiva/sclera: Conjunctivae normal.  ?Cardiovascular:  ?   Rate and Rhythm: Normal rate and regular rhythm.  ?   Pulses: Normal pulses.  ?   Heart sounds: Normal heart sounds.  ?Pulmonary:  ?   Effort: Pulmonary effort is normal. No respiratory distress.  ?   Breath sounds: Normal breath sounds.  ?Abdominal:  ?   General: Bowel sounds are normal. There is no distension.  ?   Palpations: Abdomen is soft.  ?   Tenderness: There is no abdominal tenderness.  ?Neurological:  ?   General: No focal deficit present.  ?   Mental Status: She is alert and oriented to person, place, and time. Mental status is at baseline.  ?Psychiatric:     ?   Mood and Affect: Mood normal.     ?   Behavior: Behavior normal.     ?   Thought Content: Thought content normal.     ?   Judgment: Judgment normal.  ? ? ? ?UC Treatments / Results  ?Labs ?(all labs ordered are listed, but only abnormal results are displayed) ?Labs Reviewed  ?SARS CORONAVIRUS 2 (TAT 6-24 HRS)  ?POC INFLUENZA A AND B ANTIGEN (URGENT CARE ONLY)  ? ? ?EKG ? ? ?Radiology ?No results found. ? ?Procedures ?Procedures (including critical care time) ? ?Medications Ordered in UC ?Medications - No data to display ? ?Initial Impression / Assessment and Plan / UC Course  ?I have reviewed the triage vital signs and the nursing notes. ? ?Pertinent labs & imaging results that were available during my care of the patient were reviewed by me and considered in my medical decision making (see chart for details). ? ?  ? ?Suspect viral cause to patient's symptoms.  Flu was negative.  COVID test pending.  Discussed supportive care and fever monitoring and management with patient.  Discussed return precautions.  Patient verbalized understanding and was agreeable  with plan. ?Final Clinical Impressions(s)  / UC Diagnoses  ? ?Final diagnoses:  ?Fever, unspecified  ? ? ? ?Discharge Instructions   ? ?  ?Your rapid flu was negative.  COVID test is pending.  We will call if it is positive.  Please continue to monitor fever and treat as appropriate with ibuprofen and/or Tylenol.  Follow-up if symptoms persist or worsen. ? ? ? ? ?ED Prescriptions   ?None ?  ? ?PDMP not reviewed this encounter. ?  ?Gustavus BryantMound, Rafael Salway E, [place]NP ?07/31/21 1813 ? ?

## 2021-08-01 LAB — SARS CORONAVIRUS 2 (TAT 6-24 HRS): SARS Coronavirus 2: NEGATIVE

## 2023-09-16 ENCOUNTER — Ambulatory Visit (HOSPITAL_COMMUNITY)
Admission: EM | Admit: 2023-09-16 | Discharge: 2023-09-16 | Disposition: A | Discharge status: Home / Self Care | Attending: Family Medicine | Admitting: Family Medicine

## 2023-09-16 ENCOUNTER — Encounter (HOSPITAL_COMMUNITY): Payer: Self-pay

## 2023-09-16 DIAGNOSIS — Z3201 Encounter for pregnancy test, result positive: Secondary | ICD-10-CM

## 2023-09-16 LAB — POCT URINE PREGNANCY: Preg Test, Ur: POSITIVE — AB

## 2023-09-16 MED ORDER — VITAMIN B-6 25 MG PO TABS: 25.0000 mg | ORAL_TABLET | Freq: Three times a day (TID) | ORAL | 2 refills | Status: AC | PRN

## 2023-09-16 MED ORDER — PRENATAL 27-1 MG PO TABS: ORAL_TABLET | ORAL | 2 refills | Status: AC

## 2023-09-16 MED ORDER — DOXYLAMINE SUCCINATE (SLEEP) 25 MG PO TABS: 12.5000 mg | ORAL_TABLET | Freq: Four times a day (QID) | ORAL | 2 refills | Status: AC | PRN

## 2023-09-16 NOTE — ED Triage Notes (Signed)
 Patient reports that she has been having nausea and lightheadedness x 1 week. Patient states she took a home pregnancy test and it was positive.

## 2023-09-19 NOTE — ED Provider Notes (Signed)
 [facility]   [id number] 09/16/23 Arrival Time: 1355  ASSESSMENT & PLAN:  1. Positive pregnancy test    Copy of + test given. Results for orders placed or performed during the hospital encounter of 09/16/23  POCT urine pregnancy   Collection Time: 09/16/23  2:39 PM  Result Value Ref Range   Preg Test, Ur Positive (A) Negative   Meds ordered this encounter  Medications   pyridOXINE (VITAMIN B6) 25 MG tablet    Sig: Take 1 tablet (25 mg total) by mouth 3 (three) times daily as needed.    Dispense:  60 tablet    Refill:  2   doxylamine , Sleep, (UNISOM ) 25 MG tablet    Sig: Take 0.5 tablets (12.5 mg total) by mouth every 6 (six) hours as needed.    Dispense:  30 tablet    Refill:  2   Prenatal 27-1 MG TABS    Sig: Take one daily.    Dispense:  30 tablet    Refill:  2     Follow-up Information     Schedule an appointment as soon as possible for a visit  with [facility].   Specialty: Obstetrics and Gynecology Contact information: [address] [place]  [address] [phone]                Reviewed expectations re: course of current medical issues. Questions answered. Outlined signs and symptoms indicating need for more acute intervention. Understanding verbalized. After Visit Summary given.   SUBJECTIVE: History from: Patient. Katherine Velasquez is a 28 y.o. female. Patient reports that she has been having nausea and lightheadedness x 1 week. Patient states she took a home pregnancy test and it was positive. Mild nausea. Tolerating PO. Patient's last menstrual period was 08/16/2023.   OBJECTIVE:  Vitals:   09/16/23 1428  BP: 119/78  Pulse: 79  Resp: 16  Temp: 98.1 F (36.7 C)  TempSrc: Oral  SpO2: 98%    General appearance: alert; no distress Psychological: alert and cooperative; normal mood and affect  Labs: Results for orders placed or performed during the  hospital encounter of 09/16/23  POCT urine pregnancy   Collection Time: 09/16/23  2:39 PM  Result Value Ref Range   Preg Test, Ur Positive (A) Negative   Labs Reviewed  POCT URINE PREGNANCY - Abnormal; Notable for the following components:      Result Value   Preg Test, Ur Positive (*)    All other components within normal limits    Imaging: No results found.  No Known Allergies  Past Medical History:  Diagnosis Date   Anemia    Medical history non-contributory    Social History   Socioeconomic History   Marital status: Single    Spouse name: Not on file   Number of children: Not on file   Years of education: Not on file   Highest education level: Not on file  Occupational History   Not on file  Tobacco Use   Smoking status: Never   Smokeless tobacco: Never  Vaping Use   Vaping status: Never Used  Substance and Sexual Activity   Alcohol use: No   Drug use: No   Sexual activity: Never    Birth control/protection: None  Other Topics Concern   Not on file  Social History Narrative   Not on file   Social Drivers of Health   Financial Resource Strain: Not on file  Food  Insecurity: Not on file  Transportation Needs: Not on file  Physical Activity: Not on file  Stress: Not on file  Social Connections: Not on file  Intimate Partner Violence: Not on file   Family History  Problem Relation Age of Onset   Arthritis Father    Past Surgical History:  Procedure Laterality Date   NO PAST SURGERIES       Rolinda Rogue, MD 09/19/23 1029

## 2023-10-08 ENCOUNTER — Encounter (HOSPITAL_COMMUNITY): Payer: Self-pay | Admitting: *Deleted

## 2023-10-08 ENCOUNTER — Inpatient Hospital Stay (HOSPITAL_COMMUNITY)
Admission: AD | Admit: 2023-10-08 | Discharge: 2023-10-08 | Disposition: A | Discharge status: Home / Self Care | Attending: Obstetrics and Gynecology | Admitting: Obstetrics and Gynecology

## 2023-10-08 ENCOUNTER — Inpatient Hospital Stay (HOSPITAL_COMMUNITY)

## 2023-10-08 DIAGNOSIS — R109 Unspecified abdominal pain: Secondary | ICD-10-CM

## 2023-10-08 DIAGNOSIS — O26899 Other specified pregnancy related conditions, unspecified trimester: Secondary | ICD-10-CM

## 2023-10-08 DIAGNOSIS — O99611 Diseases of the digestive system complicating pregnancy, first trimester: Secondary | ICD-10-CM | POA: Insufficient documentation

## 2023-10-08 DIAGNOSIS — Z3A01 Less than 8 weeks gestation of pregnancy: Secondary | ICD-10-CM | POA: Diagnosis not present

## 2023-10-08 DIAGNOSIS — K5909 Other constipation: Secondary | ICD-10-CM | POA: Diagnosis not present

## 2023-10-08 DIAGNOSIS — O26891 Other specified pregnancy related conditions, first trimester: Secondary | ICD-10-CM | POA: Diagnosis present

## 2023-10-08 DIAGNOSIS — R1012 Left upper quadrant pain: Secondary | ICD-10-CM | POA: Diagnosis not present

## 2023-10-08 LAB — CBC
HCT: 36.5 % (ref 36.0–46.0)
Hemoglobin: 12.1 g/dL (ref 12.0–15.0)
MCH: 30.7 pg (ref 26.0–34.0)
MCHC: 33.2 g/dL (ref 30.0–36.0)
MCV: 92.6 fL (ref 80.0–100.0)
Platelets: 294 K/uL (ref 150–400)
RBC: 3.94 MIL/uL (ref 3.87–5.11)
RDW: 14.6 % (ref 11.5–15.5)
WBC: 8.1 K/uL (ref 4.0–10.5)
nRBC: 0 % (ref 0.0–0.2)

## 2023-10-08 LAB — URINALYSIS, ROUTINE W REFLEX MICROSCOPIC
Bilirubin Urine: NEGATIVE
Glucose, UA: NEGATIVE mg/dL
Hgb urine dipstick: NEGATIVE
Ketones, ur: NEGATIVE mg/dL
Leukocytes,Ua: NEGATIVE
Nitrite: NEGATIVE
Protein, ur: NEGATIVE mg/dL
Specific Gravity, Urine: 1.009 (ref 1.005–1.030)
pH: 6 (ref 5.0–8.0)

## 2023-10-08 LAB — HCG, QUANTITATIVE, PREGNANCY: hCG, Beta Chain, Quant, S: 11505 m[IU]/mL — ABNORMAL HIGH (ref <5)

## 2023-10-08 MED ORDER — POLYETHYLENE GLYCOL 3350 17 G PO PACK
17.0000 g | PACK | Freq: Every day | ORAL | Status: DC
  Filled 2023-10-08: qty 1

## 2023-10-08 NOTE — MAU Provider Note (Signed)
 History     CSN: [id number]  Arrival date and time: 10/08/23 1102   None     Chief Complaint  Patient presents with   Abdominal Pain   HPI Patient presents for severe left-sided abdominal pain.  She is a G2, P1 at 7 weeks and 4 days by LMP.  She denies any ultrasounds this pregnancy.  Reports that sharp abdominal pain started this morning and is focused on her left side.  Denies any bleeding, cramping.  Reports chronic constipation and she normally has bowel movements every 2 days but this does not feel the same.  Denies any burning when she pees.  Denies any other symptoms.  OB History     Gravida  2   Para  1   Term  1   Preterm  0   AB  0   Living  1      SAB  0   IAB  0   Ectopic  0   Multiple  0   Live Births  1           Past Medical History:  Diagnosis Date   Anemia     Past Surgical History:  Procedure Laterality Date   NO PAST SURGERIES      Family History  Problem Relation Age of Onset   Healthy Mother    Arthritis Father     Social History   Tobacco Use   Smoking status: Never   Smokeless tobacco: Never  Vaping Use   Vaping status: Never Used  Substance Use Topics   Alcohol use: No   Drug use: No    Allergies: No Known Allergies  Medications Prior to Admission  Medication Sig Dispense Refill Last Dose/Taking   Prenatal 27-1 MG TABS Take one daily. 30 tablet 2 10/08/2023   doxylamine , Sleep, (UNISOM ) 25 MG tablet Take 0.5 tablets (12.5 mg total) by mouth every 6 (six) hours as needed. 30 tablet 2    pyridOXINE (VITAMIN B6) 25 MG tablet Take 1 tablet (25 mg total) by mouth 3 (three) times daily as needed. 60 tablet 2     Review of Systems  Constitutional:  Negative for fever.  HENT:  Negative for congestion.   Cardiovascular:  Negative for chest pain.  Gastrointestinal:  Positive for abdominal pain and constipation. Negative for nausea and vomiting.   Physical Exam   Blood pressure 117/67, pulse 65, temperature 98.3  F (36.8 C), temperature source Oral, resp. rate 17, height 5' 3 (1.6 m), weight 83.2 kg, last menstrual period 08/16/2023, SpO2 100%.  Physical Exam Vitals and nursing note reviewed.  Constitutional:      Appearance: Normal appearance.  HENT:     Head: Normocephalic and atraumatic.     Nose: No congestion or rhinorrhea.  Eyes:     Extraocular Movements: Extraocular movements intact.  Cardiovascular:     Rate and Rhythm: Normal rate.  Pulmonary:     Effort: Pulmonary effort is normal.  Abdominal:     Palpations: Abdomen is soft.     Tenderness: There is abdominal tenderness in the left upper quadrant. There is no right CVA tenderness or left CVA tenderness.  Musculoskeletal:        General: Normal range of motion.     Cervical back: Normal range of motion.  Skin:    General: Skin is warm.     Capillary Refill: Capillary refill takes less than 2 seconds.  Neurological:     General: No focal deficit  present.     Mental Status: She is alert.     Cranial Nerves: No cranial nerve deficit.  Psychiatric:        Mood and Affect: Mood normal.        Behavior: Behavior normal.     MAU Course  Procedures  MDM CBC Ultrasound hCG  Assessment and Plan  Katherine Velasquez is a 28 year old G2, P1 at 7 weeks and 4 days presenting for severe left-sided abdominal pain.  Left-sided abdominal pain Patient with sudden onset left-sided abdominal pain today.  Issues with chronic constipation.  Approximately [redacted] weeks gestation.  Denies any bleeding.  Pain is in the left upper quadrant.  CBC within normal limits, hCG 11,505.  Ultrasound showing single intrauterine gestational sac with no fetal pole or yolk sac.  The mean sac diameter was 23.6.  This is concerning for an embryonic pregnancy but not diagnostic because normal mean sac diameter is between 16 and 24.  Informed patient that this is likely a failed pregnancy but not definitive and she will need repeat ultrasound in 11 to 14 days.   Discussed strict return precautions.  Discussed that the abdominal pain could be coming from the start of a miscarriage but could also be related to her constipation so recommended daily MiraLAX  until she is having soft stools.  Patient discharged home.  Tri Chittick V Stormy Sabol 10/08/2023, 11:46 AM

## 2023-10-08 NOTE — Discharge Instructions (Addendum)
 It was a pleasure taking care of you today.  I am sorry you are having this abdominal pain.  We did an ultrasound which showed a sac in your uterus but no baby in that sac.  It is concerning for failed pregnancy but not definitive so we need to repeat ultrasound in 14 days.  I sent a message to the clinic to get you scheduled for this but please make sure it is at least 14 days from today.  The pain could also be coming from constipation so I sent some MiraLAX  to your pharmacy for you to take daily until you are having normal regular soft bowel movements.  If you start having vaginal bleeding, worse abdominal pain or any other concerns I want you to return.

## 2023-10-08 NOTE — MAU Note (Signed)
 Katherine Velasquez is a 28 y.o. at [redacted]w[redacted]d here in MAU reporting: pain woke her this morning.  Sharp pain in left upper quad.  No bleeding. No diarrhea or constipation, denies urinary symptoms.  Pt tearful.  LMP: 5/25 Onset of complaint: 0930 Pain score: mild Vitals:   10/08/23 1116  BP: 117/67  Pulse: 65  Resp: 17  Temp: 98.3 F (36.8 C)  SpO2: 100%      Lab orders placed from triage:

## 2023-10-17 ENCOUNTER — Inpatient Hospital Stay (HOSPITAL_COMMUNITY)

## 2023-10-17 ENCOUNTER — Encounter (HOSPITAL_COMMUNITY): Payer: Self-pay | Admitting: Obstetrics and Gynecology

## 2023-10-17 ENCOUNTER — Inpatient Hospital Stay (HOSPITAL_COMMUNITY)
Admission: AD | Admit: 2023-10-17 | Discharge: 2023-10-17 | Disposition: A | Discharge status: Home / Self Care | Attending: Obstetrics and Gynecology | Admitting: Obstetrics and Gynecology

## 2023-10-17 DIAGNOSIS — Z3A08 8 weeks gestation of pregnancy: Secondary | ICD-10-CM

## 2023-10-17 DIAGNOSIS — O039 Complete or unspecified spontaneous abortion without complication: Secondary | ICD-10-CM | POA: Diagnosis not present

## 2023-10-17 DIAGNOSIS — O209 Hemorrhage in early pregnancy, unspecified: Secondary | ICD-10-CM

## 2023-10-17 DIAGNOSIS — R110 Nausea: Secondary | ICD-10-CM | POA: Diagnosis present

## 2023-10-17 DIAGNOSIS — R1030 Lower abdominal pain, unspecified: Secondary | ICD-10-CM | POA: Diagnosis present

## 2023-10-17 LAB — TYPE AND SCREEN
ABO/RH(D): O POS
Antibody Screen: NEGATIVE

## 2023-10-17 LAB — CBC
HCT: 33.9 % — ABNORMAL LOW (ref 36.0–46.0)
Hemoglobin: 11.3 g/dL — ABNORMAL LOW (ref 12.0–15.0)
MCH: 30.6 pg (ref 26.0–34.0)
MCHC: 33.3 g/dL (ref 30.0–36.0)
MCV: 91.9 fL (ref 80.0–100.0)
Platelets: 294 K/uL (ref 150–400)
RBC: 3.69 MIL/uL — ABNORMAL LOW (ref 3.87–5.11)
RDW: 14.6 % (ref 11.5–15.5)
WBC: 13 K/uL — ABNORMAL HIGH (ref 4.0–10.5)
nRBC: 0 % (ref 0.0–0.2)

## 2023-10-17 LAB — HCG, QUANTITATIVE, PREGNANCY: hCG, Beta Chain, Quant, S: 1228 m[IU]/mL — ABNORMAL HIGH (ref <5)

## 2023-10-17 MED ORDER — ONDANSETRON HCL 4 MG/2ML IJ SOLN
INTRAMUSCULAR | Status: AC
  Filled 2023-10-17: qty 2

## 2023-10-17 MED ORDER — LACTATED RINGERS IV SOLN: INTRAVENOUS | Status: DC

## 2023-10-17 MED ORDER — SODIUM CHLORIDE 0.9 % IV SOLN
8.0000 mg | Freq: Once | INTRAVENOUS | Status: DC
  Filled 2023-10-17: qty 4

## 2023-10-17 MED ORDER — HYDROMORPHONE HCL 1 MG/ML IJ SOLN
INTRAMUSCULAR | Status: DC
Start: 2023-10-17 — End: 2023-10-18
  Filled 2023-10-17: qty 1

## 2023-10-17 MED ORDER — ONDANSETRON 4 MG PO TBDP
8.0000 mg | ORAL_TABLET | Freq: Once | ORAL | Status: DC
  Filled 2023-10-17: qty 2

## 2023-10-17 MED ORDER — IBUPROFEN 800 MG PO TABS: 800.0000 mg | ORAL_TABLET | Freq: Once | ORAL | Status: DC

## 2023-10-17 MED ORDER — ONDANSETRON 8 MG PO TBDP: 8.0000 mg | ORAL_TABLET | Freq: Three times a day (TID) | ORAL | 1 refills | Status: AC | PRN

## 2023-10-17 MED ORDER — ONDANSETRON HCL 4 MG/2ML IJ SOLN
4.0000 mg | Freq: Once | INTRAMUSCULAR | Status: AC
  Administered 2023-10-17: 4 mg via INTRAVENOUS

## 2023-10-17 MED ORDER — KETOROLAC TROMETHAMINE 30 MG/ML IJ SOLN
30.0000 mg | Freq: Once | INTRAMUSCULAR | Status: AC
  Administered 2023-10-17: 30 mg via INTRAVENOUS
  Filled 2023-10-17: qty 1

## 2023-10-17 MED ORDER — IBUPROFEN 800 MG PO TABS: 800.0000 mg | ORAL_TABLET | Freq: Three times a day (TID) | ORAL | 3 refills | Status: AC | PRN

## 2023-10-17 MED ORDER — HYDROMORPHONE HCL 1 MG/ML IJ SOLN
1.0000 mg | Freq: Once | INTRAMUSCULAR | Status: AC
  Administered 2023-10-17: 1 mg via INTRAVENOUS
  Filled 2023-10-17: qty 1

## 2023-10-17 MED ORDER — LACTATED RINGERS IV BOLUS
500.0000 mL | Freq: Once | INTRAVENOUS | Status: AC
  Administered 2023-10-17: 500 mL via INTRAVENOUS

## 2023-10-17 MED ORDER — LACTATED RINGERS IV BOLUS
1000.0000 mL | Freq: Once | INTRAVENOUS | Status: AC
  Administered 2023-10-17: 1000 mL via INTRAVENOUS

## 2023-10-17 NOTE — Discharge Instructions (Addendum)
 I am so sorry you have been through this. There was nothing that you did or did not do to cause this to happen. Please let us  know if there is anything that you need for support. The office will call you to schedule a follow up appointment within the next couple of weeks so we can check in with you to see how you are doing.  You can take ibuprofen  800 mg three times daily for the next several days. Make sure your are drinking plenty of water while taking high doses of ibuprofen . If needed, you can also take Tylenol  500 mg every 4-6 hours. You can also take the Zofran  for any nausea.  Reasons to return to MAU at [facility]: If you have heavier bleeding that soaks through more that 2 pads per hour for an hour or more If you bleed so much that you feel like you might pass out or you do pass out If you have significant abdominal pain that is not improved with ibuprofen  800 mg every 8 hours AND Tylenol  1000 mg every 8 hours as needed for pain If you develop a fever > 100.5

## 2023-10-17 NOTE — MAU Provider Note (Signed)
 Chief Complaint:  Vaginal Bleeding and Nausea   HPI   None     Katherine Velasquez is a 28 y.o. G2P1011 at [redacted]w[redacted]d who presents to maternity admissions reporting heavy vaginal bleeding. She reports heavy vaginal bleeding that started around 1000 today while sitting on the couch. Since then, she has bled through 2 Maxi pads per hour. She describes the bleeding as dark red with clots. She endorses abdominal cramps that started yesterday. Denies dizziness, lightheadedness, headache, fever, chills. She does note some nausea that started when she arrived to MAU. Denies recent intercourse. She had acetaminophen  1000 mg at 1000 with minimal improvement.   Pregnancy Course: Seen in MAU on 7/17 for abdominal pain. At that time, CBC within normal limits, hCG 11,505, and US  with IUGS without YS or FP. Recommendation at that time for repeat US  in 11-14 days for possible embryonic pregnancy.  Past Medical History:  Diagnosis Date   Anemia    OB History  Gravida Para Term Preterm AB Living  2 1 1  0 1 1  SAB IAB Ectopic Multiple Live Births  1 0 0 0 1    # Outcome Date GA Lbr Len/2nd Weight Sex Type Anes PTL Lv  2 Term 10/06/20 [redacted]w[redacted]d 06:12 3005 g M Vag-Vacuum EPI  LIV  1 SAB  [redacted]w[redacted]d    SAB      Past Surgical History:  Procedure Laterality Date   NO PAST SURGERIES     Family History  Problem Relation Age of Onset   Healthy Mother    Arthritis Father    Social History   Tobacco Use   Smoking status: Never   Smokeless tobacco: Never  Vaping Use   Vaping status: Never Used  Substance Use Topics   Alcohol use: No   Drug use: No   No Known Allergies Medications Prior to Admission  Medication Sig Dispense Refill Last Dose/Taking   acetaminophen  (TYLENOL ) 500 MG tablet Take 1,000 mg by mouth every 6 (six) hours as needed.   10/17/2023 at  9:00 AM   Prenatal 27-1 MG TABS Take one daily. 30 tablet 2 10/16/2023 Evening   doxylamine , Sleep, (UNISOM ) 25 MG tablet Take 0.5 tablets (12.5 mg total) by mouth  every 6 (six) hours as needed. 30 tablet 2    pyridOXINE (VITAMIN B6) 25 MG tablet Take 1 tablet (25 mg total) by mouth 3 (three) times daily as needed. 60 tablet 2     I have reviewed patient's Past Medical Hx, Surgical Hx, Family Hx, Social Hx, medications and allergies.   ROS  Pertinent items noted in HPI and remainder of comprehensive ROS otherwise negative.   PHYSICAL EXAM  Patient Vitals for the past 24 hrs:  BP Temp Temp src Pulse Resp SpO2 Height Weight  10/17/23 1800 (!) 89/47 -- -- (!) 53 20 -- -- --  10/17/23 1350 (!) 91/56 (!) 97.3 F (36.3 C) Axillary 72 18 100 % 5' 3 (1.6 m) 83.4 kg    Constitutional: Well-developed, well-nourished female in no acute distress.  HEENT: atraumatic, normocephalic. Neck has normal ROM. EOM intact. Cardiovascular: normal rate & rhythm, warm and well-perfused Respiratory: normal effort, no problems with respiration noted GI: Abd soft, non-tender, non-distended MSK: Extremities nontender, no edema, normal ROM Skin: warm and dry. Acyanotic, no jaundice or pallor. Neurologic: Alert and oriented x 4. No abnormal coordination. Psychiatric: Normal mood. Speech not slurred, not rapid/pressured. Patient is cooperative. GU: no CVA tenderness Pelvic exam: VULVA: normal appearing vulva with no masses,  tenderness or lesions, VAGINA: vaginal vault filled with blood and clots, tissue removed with ringed forceps and blood with faux swabs CERVIX: unable to visualize due to copious amounts of blood, exam chaperoned by Maurilio Hover RN.   Labs: Results for orders placed or performed during the hospital encounter of 10/17/23 (from the past 24 hours)  Type and screen     Status: None   Collection Time: 10/17/23  2:00 PM  Result Value Ref Range   ABO/RH(D) O POS    Antibody Screen NEG    Sample Expiration      10/20/2023,2359 Performed at [facility], [address] [place], [place] [address]   CBC     Status: Abnormal   Collection Time: 10/17/23   2:02 PM  Result Value Ref Range   WBC 13.0 (H) 4.0 - 10.5 K/uL   RBC 3.69 (L) 3.87 - 5.11 MIL/uL   Hemoglobin 11.3 (L) 12.0 - 15.0 g/dL   HCT 66.0 (L) 63.9 - 53.9 %   MCV 91.9 80.0 - 100.0 fL   MCH 30.6 26.0 - 34.0 pg   MCHC 33.3 30.0 - 36.0 g/dL   RDW 85.3 88.4 - 84.4 %   Platelets 294 150 - 400 K/uL   nRBC 0.0 0.0 - 0.2 %  hCG, quantitative, pregnancy     Status: Abnormal   Collection Time: 10/17/23  2:02 PM  Result Value Ref Range   hCG, Beta Chain, Quant, S 1,228 (H) <5 mIU/mL    Imaging:  US  OB Transvaginal Result Date: 10/17/2023 CLINICAL DATA:  Vaginal bleeding in 1st trimester pregnancy. EXAM: TRANSVAGINAL OB ULTRASOUND TECHNIQUE: Transvaginal ultrasound was performed for complete evaluation of the gestation as well as the maternal uterus, adnexal regions, and pelvic cul-de-sac. COMPARISON:  10/08/2023 FINDINGS: Intrauterine gestational sac: Single, with increasingly irregular shape and location now in lower uterine segment Yolk sac:  Not Visualized. Embryo:  Not Visualized. MSD: 26 mm   7 w   4 d Maternal uterus/adnexae: Both ovaries are normal appearance. No adnexal mass or abnormal free fluid identified. IMPRESSION: Findings meet definitive criteria for failed pregnancy. This follows SRU consensus guidelines: Diagnostic Criteria for Nonviable Pregnancy Early in the First Trimester. LOISE [place] Med [phone]. Electronically Signed   By: Norleen DELENA Kil M.D.   On: 10/17/2023 15:41   MDM & MAU COURSE  MDM: High  MAU Course: -Hypotensive, start IV fluids. -CBC and hCG for heavy vaginal bleeding. US  for comparison to previous showing IUGS on 7/17. -Speculum exam with copious amounts of blood and blood clots, likely SAB. Discussed option for Anora testing, patient will consider while awaiting US . -Zofran  and hydrocodone for nausea and pain. -CBC with mild anemia, Hgb 11.3, decrease from 12.1 on 10/08/2023. hCG has declined to 1,228. -US  with irregularly shaped IUGS now in lower  uterine segment. Meets definitive criteria for failed pregnancy. -On reassessment, nausea has resolved but no improvement of pain. Toradol  30 mg IV for pain. Additional clots and blood completely saturating pad in MAU. -Discussed with Dr. Izell, will try removal of POC with speculum. If unsuccessful, may need surgical intervention. Starting maintenance fluids and ordering type & screen and ABO. -Patient is interested in Anora testing of POC. -Dr. Izell to bedside for evaluation and removal of remaining POC. Confirmed with bedside US  and color Doppler US . -Observe and ensure tolerating PO intake, then discharge home. -Low BP, improved to 110/56 after additional fluid bolus.  Differential diagnosis considered for 1st trimester vaginal bleeding includes  but is not limited to: ectopic pregnancy, complete spontaneous abortion, incomplete abortion, missed abortion, threatened abortion, embryonic/fetal demise, cervical insufficiency, cervical or vaginal disorder   Orders Placed This Encounter  Procedures   US  OB Transvaginal   CBC   hCG, quantitative, pregnancy   Anora Miscarriage Test - Fresh   Diet NPO time specified   Type and screen   Discharge patient   Meds ordered this encounter  Medications   lactated ringers  bolus 1,000 mL   DISCONTD: ondansetron  (ZOFRAN -ODT) disintegrating tablet 8 mg   HYDROmorphone  (DILAUDID ) injection 1 mg   DISCONTD: ondansetron  (ZOFRAN ) 8 mg in sodium chloride  0.9 % 50 mL IVPB   ondansetron  (ZOFRAN ) injection 4 mg   ondansetron  (ZOFRAN ) 4 MG/2ML injection    Hollie Herring L: cabinet override   HYDROmorphone  (DILAUDID ) 1 MG/ML injection    Hollie Herring L: cabinet override   DISCONTD: ibuprofen  (ADVIL ) tablet 800 mg   lactated ringers  infusion   ketorolac  (TORADOL ) 30 MG/ML injection 30 mg   lactated ringers  bolus 500 mL   ibuprofen  (ADVIL ) 800 MG tablet    Sig: Take 1 tablet (800 mg total) by mouth every 8 (eight) hours as needed.    Dispense:  30 tablet     Refill:  3   ondansetron  (ZOFRAN -ODT) 8 MG disintegrating tablet    Sig: Take 1 tablet (8 mg total) by mouth every 8 (eight) hours as needed for nausea or vomiting.    Dispense:  20 tablet    Refill:  1    ASSESSMENT   1. Vaginal bleeding in pregnancy, first trimester   2. Spontaneous abortion     PLAN  Discharge home in stable condition with bleeding precautions.  Ibuprofen  and Zofran  sent to pharmacy.  Follow up with office visit in 1-2 weeks.    Allergies as of 10/17/2023   No Known Allergies      Medication List     TAKE these medications    acetaminophen  500 MG tablet Commonly known as: TYLENOL  Take 1,000 mg by mouth every 6 (six) hours as needed.   doxylamine  (Sleep) 25 MG tablet Commonly known as: UNISOM  Take 0.5 tablets (12.5 mg total) by mouth every 6 (six) hours as needed.   ibuprofen  800 MG tablet Commonly known as: ADVIL  Take 1 tablet (800 mg total) by mouth every 8 (eight) hours as needed.   ondansetron  8 MG disintegrating tablet Commonly known as: ZOFRAN -ODT Take 1 tablet (8 mg total) by mouth every 8 (eight) hours as needed for nausea or vomiting.   Prenatal 27-1 MG Tabs Take one daily.   pyridOXINE 25 MG tablet Commonly known as: VITAMIN B6 Take 1 tablet (25 mg total) by mouth 3 (three) times daily as needed.        Joesph DELENA Sear, PA

## 2023-10-17 NOTE — MAU Note (Signed)
 Katherine Velasquez is a 28 y.o. at [redacted]w[redacted]d here in MAU reporting: heavy vaginal bleeding with clots that started at 1000 while she was sitting on the couch. Reports bleeding through 6-8 maxi pads since 1000. Dark red bleeding. Reports lower abdominal cramping that started yesterday. Denies any recent sexual intercourse. Reports nausea that started once she got to the hospital. Denies any dizziness or light headedness.   Onset of complaint: 1000 Pain score: 9 Vitals:   10/17/23 1350  BP: (!) 91/56  Pulse: 72  Resp: 18  Temp: (!) 97.3 F (36.3 C)  SpO2: 100%     FHT:n/a  Lab orders placed from triage:

## 2023-10-20 ENCOUNTER — Telehealth: Payer: Self-pay | Admitting: Family Medicine

## 2023-10-20 ENCOUNTER — Other Ambulatory Visit

## 2023-10-20 NOTE — Telephone Encounter (Signed)
 Spoke to patient about an appointment scheduled for a follow up visit.

## 2023-10-21 ENCOUNTER — Other Ambulatory Visit

## 2023-11-02 ENCOUNTER — Ambulatory Visit: Payer: Self-pay | Admitting: Family Medicine

## 2023-11-02 LAB — ANORA MISCARRIAGE TEST - FRESH

## 2023-11-02 NOTE — Progress Notes (Deleted)
   Subjective:   Patient Name: Katherine Velasquez, female   DOB: 1995/05/15, 28 y.o.  MRN: [id number]  HPI Seen for follow up of SAB 2 weeks ago. Denies bleeding, cramping. Menses {Blank single:19197::has,has not} returned yet. {Blank single:19197::Plans on,Does not plan on} becoming pregnant soon.     Review of Systems     Objective:   Physical Exam  Constitutional: She is oriented to person, place, and time. She appears well-developed and well-nourished.  Cardiovascular: Normal rate.  Abdominal: Soft. She exhibits no distension.  Neurological: She is alert and oriented to person, place, and time.  Skin: Skin is warm and dry.  Psychiatric: She has a normal mood and affect. Her behavior is normal. Judgment and thought content normal.      Assessment & Plan:  1. Spontaneous abortion (Primary) ***  Patient counseled regarding future pregnancy plans and timing  Contraception: ***  Joesph DELENA Sear PA-C 11/03/23  8:29 AM

## 2023-11-03 ENCOUNTER — Ambulatory Visit: Admitting: Family Medicine

## 2023-11-03 DIAGNOSIS — O039 Complete or unspecified spontaneous abortion without complication: Secondary | ICD-10-CM
